# Patient Record
Sex: Female | Born: 1965 | Race: White | Hispanic: No | Marital: Married | Smoking: Current every day smoker
Health system: Southern US, Community
[De-identification: ages and names within clinical notes are randomized; demographics above are authoritative.]

## PROBLEM LIST (undated history)

## (undated) DIAGNOSIS — I639 Cerebral infarction, unspecified: Secondary | ICD-10-CM

## (undated) DIAGNOSIS — C801 Malignant (primary) neoplasm, unspecified: Secondary | ICD-10-CM

## (undated) DIAGNOSIS — J45909 Unspecified asthma, uncomplicated: Secondary | ICD-10-CM

## (undated) DIAGNOSIS — G43909 Migraine, unspecified, not intractable, without status migrainosus: Secondary | ICD-10-CM

## (undated) HISTORY — DX: Cerebral infarction, unspecified: I63.9

## (undated) HISTORY — DX: Unspecified asthma, uncomplicated: J45.909

## (undated) HISTORY — DX: Migraine, unspecified, not intractable, without status migrainosus: G43.909

## (undated) HISTORY — DX: Malignant (primary) neoplasm, unspecified: C80.1

---

## 1993-07-23 HISTORY — PX: MASTECTOMY: SHX3

## 2006-08-20 ENCOUNTER — Emergency Department: Payer: Self-pay | Admitting: Emergency Medicine

## 2008-08-10 ENCOUNTER — Ambulatory Visit: Payer: Self-pay

## 2008-12-28 ENCOUNTER — Emergency Department: Payer: Self-pay | Admitting: Unknown Physician Specialty

## 2009-07-07 ENCOUNTER — Emergency Department: Payer: Self-pay | Admitting: Emergency Medicine

## 2013-12-16 ENCOUNTER — Ambulatory Visit: Payer: Self-pay | Admitting: Internal Medicine

## 2014-07-23 HISTORY — PX: CHOLECYSTECTOMY: SHX55

## 2014-08-17 ENCOUNTER — Emergency Department: Payer: Self-pay | Admitting: Emergency Medicine

## 2014-12-08 ENCOUNTER — Other Ambulatory Visit: Payer: Self-pay

## 2014-12-15 ENCOUNTER — Ambulatory Visit: Payer: Self-pay | Admitting: Internal Medicine

## 2015-01-12 ENCOUNTER — Ambulatory Visit: Payer: Self-pay | Admitting: Internal Medicine

## 2015-01-12 DIAGNOSIS — J441 Chronic obstructive pulmonary disease with (acute) exacerbation: Secondary | ICD-10-CM | POA: Insufficient documentation

## 2015-05-24 ENCOUNTER — Other Ambulatory Visit: Payer: Self-pay

## 2015-06-07 ENCOUNTER — Other Ambulatory Visit: Payer: Self-pay

## 2015-06-07 LAB — TSH: TSH: 1.59 u[IU]/mL (ref 0.41–5.90)

## 2015-06-07 LAB — HEPATIC FUNCTION PANEL
ALT: 9 U/L (ref 7–35)
AST: 6 U/L — AB (ref 13–35)
Alkaline Phosphatase: 93 U/L (ref 25–125)
Bilirubin, Total: 0.2 mg/dL

## 2015-06-07 LAB — LIPID PANEL
CHOLESTEROL: 243 mg/dL — AB (ref 0–200)
HDL: 54 mg/dL (ref 35–70)
LDL Cholesterol: 172 mg/dL
Triglycerides: 87 mg/dL (ref 40–160)

## 2015-06-07 LAB — CBC AND DIFFERENTIAL
HCT: 42 % (ref 36–46)
HEMOGLOBIN: 14.1 g/dL (ref 12.0–16.0)
Neutrophils Absolute: 6 /uL
PLATELETS: 350 10*3/uL (ref 150–399)
WBC: 11 10*3/mL

## 2015-06-07 LAB — BASIC METABOLIC PANEL
BUN: 10 mg/dL (ref 4–21)
CREATININE: 0.7 mg/dL (ref 0.5–1.1)
GLUCOSE: 91 mg/dL
POTASSIUM: 4.3 mmol/L (ref 3.4–5.3)
Sodium: 142 mmol/L (ref 137–147)

## 2015-06-09 ENCOUNTER — Ambulatory Visit: Payer: Self-pay

## 2015-06-09 DIAGNOSIS — J45909 Unspecified asthma, uncomplicated: Secondary | ICD-10-CM | POA: Insufficient documentation

## 2015-06-09 DIAGNOSIS — Z720 Tobacco use: Secondary | ICD-10-CM | POA: Insufficient documentation

## 2015-06-09 DIAGNOSIS — E785 Hyperlipidemia, unspecified: Secondary | ICD-10-CM | POA: Insufficient documentation

## 2015-07-07 ENCOUNTER — Other Ambulatory Visit: Payer: Self-pay

## 2015-07-14 ENCOUNTER — Ambulatory Visit: Payer: Self-pay

## 2015-10-04 ENCOUNTER — Other Ambulatory Visit: Payer: Self-pay

## 2015-10-04 NOTE — Telephone Encounter (Signed)
Received fax from [facility] [address] refill request Ventolin Inhaler HFA 90 Mcg inhaler. Patient has not been seen at [facility] since November 2015 and has no future appointments scheduled.

## 2015-10-13 MED ORDER — ALBUTEROL SULFATE HFA 108 (90 BASE) MCG/ACT IN AERS: 1.0000 | INHALATION_SPRAY | RESPIRATORY_TRACT | Status: DC | PRN

## 2015-10-28 ENCOUNTER — Other Ambulatory Visit: Payer: Self-pay

## 2015-10-28 NOTE — Telephone Encounter (Signed)
Received fax from [facility] [address] [place] to refill Ventolin HFA 90 Mcg inhaler

## 2015-11-02 MED ORDER — ALBUTEROL SULFATE HFA 108 (90 BASE) MCG/ACT IN AERS: 1.0000 | INHALATION_SPRAY | RESPIRATORY_TRACT | Status: AC | PRN

## 2015-12-20 DIAGNOSIS — J449 Chronic obstructive pulmonary disease, unspecified: Secondary | ICD-10-CM

## 2015-12-20 DIAGNOSIS — J45909 Unspecified asthma, uncomplicated: Secondary | ICD-10-CM

## 2015-12-20 DIAGNOSIS — E785 Hyperlipidemia, unspecified: Secondary | ICD-10-CM

## 2015-12-20 DIAGNOSIS — Z720 Tobacco use: Secondary | ICD-10-CM

## 2016-03-14 NOTE — Progress Notes (Unsigned)
return to work

## 2016-03-20 ENCOUNTER — Ambulatory Visit: Payer: Self-pay

## 2016-03-20 ENCOUNTER — Ambulatory Visit: Payer: Self-pay | Admitting: Nurse Practitioner

## 2016-03-20 VITALS — BP 140/84 | HR 99 | Wt 168.0 lb

## 2016-03-20 DIAGNOSIS — E782 Mixed hyperlipidemia: Secondary | ICD-10-CM

## 2016-03-20 DIAGNOSIS — R5383 Other fatigue: Secondary | ICD-10-CM

## 2016-03-20 DIAGNOSIS — E162 Hypoglycemia, unspecified: Secondary | ICD-10-CM

## 2016-03-20 LAB — GLUCOSE, POCT (MANUAL RESULT ENTRY): POC Glucose: 93 mg/dl (ref 70–99)

## 2016-03-20 MED ORDER — BUDESONIDE-FORMOTEROL FUMARATE 160-4.5 MCG/ACT IN AERO: 2.0000 | INHALATION_SPRAY | Freq: Two times a day (BID) | RESPIRATORY_TRACT | 5 refills | Status: DC

## 2016-03-20 NOTE — Progress Notes (Signed)
Patient states she hasn't taken the Advair or Ventolin in 40 days, but says the Symbicort is effective by itself. She wants to solely take the Symbicort  SMOKING ONE PACK PER DAY, WAS SMOKING 4 PACKS PER DAY 2 YEARS AGO  HAS NOT HAD ANY LABS DRAWN FOR 9 MONTHS  DOES ACKNOWLEDGE ONE EPISODE OF HYPOGLYCEMIA     ALERT VAERBALLY APPROPRIATE IN NAD  NO THYROMEGALY, NO CERVICAL ADENOPATHY  BBrS DIMINISHED AS EXPECTED IN PT WITH COPD   PLAN WILL ORDER LABS AND RENEW SYMBICORT, WILL SEE PT BACK IN 6 MONTHS AND SOONER IF NECESSARY

## 2016-03-27 ENCOUNTER — Other Ambulatory Visit: Payer: Self-pay

## 2016-09-25 ENCOUNTER — Ambulatory Visit: Payer: Self-pay

## 2018-01-13 ENCOUNTER — Inpatient Hospital Stay (HOSPITAL_COMMUNITY): Payer: Self-pay

## 2018-01-13 ENCOUNTER — Encounter (HOSPITAL_COMMUNITY): Payer: Self-pay | Admitting: Internal Medicine

## 2018-01-13 ENCOUNTER — Inpatient Hospital Stay (HOSPITAL_COMMUNITY)
Admission: AD | Admit: 2018-01-13 | Discharge: 2018-01-18 | DRG: 190 | Disposition: A | Discharge status: Home / Self Care | Payer: Self-pay | Source: Other Acute Inpatient Hospital | Attending: Internal Medicine | Admitting: Internal Medicine

## 2018-01-13 DIAGNOSIS — J9622 Acute and chronic respiratory failure with hypercapnia: Secondary | ICD-10-CM | POA: Diagnosis present

## 2018-01-13 DIAGNOSIS — J9621 Acute and chronic respiratory failure with hypoxia: Secondary | ICD-10-CM | POA: Diagnosis present

## 2018-01-13 DIAGNOSIS — R739 Hyperglycemia, unspecified: Secondary | ICD-10-CM | POA: Diagnosis present

## 2018-01-13 DIAGNOSIS — T380X5A Adverse effect of glucocorticoids and synthetic analogues, initial encounter: Secondary | ICD-10-CM | POA: Diagnosis present

## 2018-01-13 DIAGNOSIS — B370 Candidal stomatitis: Secondary | ICD-10-CM | POA: Diagnosis present

## 2018-01-13 DIAGNOSIS — G4710 Hypersomnia, unspecified: Secondary | ICD-10-CM | POA: Diagnosis not present

## 2018-01-13 DIAGNOSIS — J9612 Chronic respiratory failure with hypercapnia: Secondary | ICD-10-CM

## 2018-01-13 DIAGNOSIS — Z720 Tobacco use: Secondary | ICD-10-CM

## 2018-01-13 DIAGNOSIS — J189 Pneumonia, unspecified organism: Secondary | ICD-10-CM

## 2018-01-13 DIAGNOSIS — Z8673 Personal history of transient ischemic attack (TIA), and cerebral infarction without residual deficits: Secondary | ICD-10-CM

## 2018-01-13 DIAGNOSIS — R490 Dysphonia: Secondary | ICD-10-CM | POA: Diagnosis present

## 2018-01-13 DIAGNOSIS — Z7951 Long term (current) use of inhaled steroids: Secondary | ICD-10-CM

## 2018-01-13 DIAGNOSIS — G9341 Metabolic encephalopathy: Secondary | ICD-10-CM | POA: Diagnosis present

## 2018-01-13 DIAGNOSIS — Z79899 Other long term (current) drug therapy: Secondary | ICD-10-CM

## 2018-01-13 DIAGNOSIS — Z91018 Allergy to other foods: Secondary | ICD-10-CM

## 2018-01-13 DIAGNOSIS — Z9981 Dependence on supplemental oxygen: Secondary | ICD-10-CM

## 2018-01-13 DIAGNOSIS — Z8543 Personal history of malignant neoplasm of ovary: Secondary | ICD-10-CM

## 2018-01-13 DIAGNOSIS — G9340 Encephalopathy, unspecified: Secondary | ICD-10-CM

## 2018-01-13 DIAGNOSIS — J440 Chronic obstructive pulmonary disease with acute lower respiratory infection: Secondary | ICD-10-CM | POA: Diagnosis present

## 2018-01-13 DIAGNOSIS — Z9013 Acquired absence of bilateral breasts and nipples: Secondary | ICD-10-CM

## 2018-01-13 DIAGNOSIS — J9611 Chronic respiratory failure with hypoxia: Secondary | ICD-10-CM

## 2018-01-13 DIAGNOSIS — Z853 Personal history of malignant neoplasm of breast: Secondary | ICD-10-CM

## 2018-01-13 DIAGNOSIS — J441 Chronic obstructive pulmonary disease with (acute) exacerbation: Secondary | ICD-10-CM

## 2018-01-13 DIAGNOSIS — F17210 Nicotine dependence, cigarettes, uncomplicated: Secondary | ICD-10-CM | POA: Diagnosis present

## 2018-01-13 DIAGNOSIS — R0602 Shortness of breath: Secondary | ICD-10-CM

## 2018-01-13 DIAGNOSIS — J9382 Other air leak: Secondary | ICD-10-CM | POA: Diagnosis present

## 2018-01-13 DIAGNOSIS — J9601 Acute respiratory failure with hypoxia: Secondary | ICD-10-CM | POA: Diagnosis present

## 2018-01-13 DIAGNOSIS — Z8249 Family history of ischemic heart disease and other diseases of the circulatory system: Secondary | ICD-10-CM

## 2018-01-13 LAB — GLUCOSE, CAPILLARY
GLUCOSE-CAPILLARY: 147 mg/dL — AB (ref 65–99)
Glucose-Capillary: 155 mg/dL — ABNORMAL HIGH (ref 65–99)

## 2018-01-13 LAB — D-DIMER, QUANTITATIVE: D-Dimer, Quant: 1.71 ug/mL-FEU — ABNORMAL HIGH (ref 0.00–0.50)

## 2018-01-13 LAB — LACTIC ACID, PLASMA: Lactic Acid, Venous: 1.2 mmol/L (ref 0.5–1.9)

## 2018-01-13 MED ORDER — IPRATROPIUM-ALBUTEROL 0.5-2.5 (3) MG/3ML IN SOLN
3.0000 mL | RESPIRATORY_TRACT | Status: DC
  Administered 2018-01-13: 3 mL via RESPIRATORY_TRACT
  Filled 2018-01-13 (×2): qty 3

## 2018-01-13 MED ORDER — SODIUM CHLORIDE 0.9 % IV SOLN
1.0000 g | INTRAVENOUS | Status: DC
  Administered 2018-01-13 – 2018-01-16 (×4): 1 g via INTRAVENOUS
  Filled 2018-01-13 (×5): qty 10

## 2018-01-13 MED ORDER — ACETAMINOPHEN 325 MG PO TABS: 650.0000 mg | ORAL_TABLET | Freq: Four times a day (QID) | ORAL | Status: DC | PRN

## 2018-01-13 MED ORDER — ENOXAPARIN SODIUM 40 MG/0.4ML ~~LOC~~ SOLN
40.0000 mg | SUBCUTANEOUS | Status: DC
  Administered 2018-01-13 – 2018-01-17 (×5): 40 mg via SUBCUTANEOUS
  Filled 2018-01-13 (×6): qty 0.4

## 2018-01-13 MED ORDER — MORPHINE SULFATE (PF) 2 MG/ML IV SOLN: 1.0000 mg | INTRAVENOUS | Status: DC | PRN

## 2018-01-13 MED ORDER — AZITHROMYCIN 500 MG IV SOLR
500.0000 mg | INTRAVENOUS | Status: DC
  Administered 2018-01-14 (×2): 500 mg via INTRAVENOUS
  Filled 2018-01-13 (×2): qty 500

## 2018-01-13 MED ORDER — BUDESONIDE 0.25 MG/2ML IN SUSP
0.2500 mg | Freq: Two times a day (BID) | RESPIRATORY_TRACT | Status: DC
  Administered 2018-01-13: 0.25 mg via RESPIRATORY_TRACT
  Filled 2018-01-13: qty 2

## 2018-01-13 MED ORDER — NICOTINE 14 MG/24HR TD PT24
14.0000 mg | MEDICATED_PATCH | Freq: Every day | TRANSDERMAL | Status: DC
  Administered 2018-01-14 – 2018-01-15 (×2): 14 mg via TRANSDERMAL
  Filled 2018-01-13 (×4): qty 1

## 2018-01-13 MED ORDER — ONDANSETRON HCL 4 MG/2ML IJ SOLN: 4.0000 mg | Freq: Four times a day (QID) | INTRAMUSCULAR | Status: DC | PRN

## 2018-01-13 MED ORDER — METHYLPREDNISOLONE SODIUM SUCC 125 MG IJ SOLR: 60.0000 mg | Freq: Two times a day (BID) | INTRAMUSCULAR | Status: DC

## 2018-01-13 MED ORDER — DM-GUAIFENESIN ER 30-600 MG PO TB12
1.0000 | ORAL_TABLET | Freq: Two times a day (BID) | ORAL | Status: DC
  Administered 2018-01-14 – 2018-01-18 (×9): 1 via ORAL
  Filled 2018-01-13 (×10): qty 1

## 2018-01-13 MED ORDER — BUDESONIDE 0.5 MG/2ML IN SUSP
0.5000 mg | Freq: Two times a day (BID) | RESPIRATORY_TRACT | Status: DC
  Administered 2018-01-14 – 2018-01-18 (×9): 0.5 mg via RESPIRATORY_TRACT
  Filled 2018-01-13 (×10): qty 2

## 2018-01-13 MED ORDER — ARFORMOTEROL TARTRATE 15 MCG/2ML IN NEBU
15.0000 ug | INHALATION_SOLUTION | Freq: Two times a day (BID) | RESPIRATORY_TRACT | Status: DC
  Administered 2018-01-13 – 2018-01-14 (×2): 15 ug via RESPIRATORY_TRACT
  Filled 2018-01-13 (×2): qty 2

## 2018-01-13 MED ORDER — IPRATROPIUM-ALBUTEROL 0.5-2.5 (3) MG/3ML IN SOLN: 3.0000 mL | RESPIRATORY_TRACT | Status: DC | PRN

## 2018-01-13 MED ORDER — ACETAMINOPHEN 650 MG RE SUPP: 650.0000 mg | Freq: Four times a day (QID) | RECTAL | Status: DC | PRN

## 2018-01-13 MED ORDER — ONDANSETRON HCL 4 MG PO TABS: 4.0000 mg | ORAL_TABLET | Freq: Four times a day (QID) | ORAL | Status: DC | PRN

## 2018-01-13 MED ORDER — BENZONATATE 100 MG PO CAPS
100.0000 mg | ORAL_CAPSULE | Freq: Three times a day (TID) | ORAL | Status: DC
  Administered 2018-01-14 – 2018-01-18 (×13): 100 mg via ORAL
  Filled 2018-01-13 (×14): qty 1

## 2018-01-13 MED ORDER — METHYLPREDNISOLONE SODIUM SUCC 125 MG IJ SOLR
60.0000 mg | Freq: Four times a day (QID) | INTRAMUSCULAR | Status: DC
  Administered 2018-01-13 – 2018-01-15 (×6): 60 mg via INTRAVENOUS
  Filled 2018-01-13 (×6): qty 2

## 2018-01-13 MED ORDER — IPRATROPIUM-ALBUTEROL 0.5-2.5 (3) MG/3ML IN SOLN
3.0000 mL | Freq: Four times a day (QID) | RESPIRATORY_TRACT | Status: DC
  Administered 2018-01-14 (×2): 3 mL via RESPIRATORY_TRACT
  Filled 2018-01-13 (×2): qty 3

## 2018-01-13 NOTE — Consult Note (Addendum)
PULMONARY / CRITICAL CARE MEDICINE  Name: Sandra Ramsey MRN: [id number] DOB: 11/20/1965    ADMISSION DATE:  01/13/2018 CONSULTATION DATE:  01/13/18  REFERRING MD :  Hal Hope  CHIEF COMPLAINT:  SOB   HISTORY OF PRESENT ILLNESS:  Sandra Ramsey is a 52 y.o. female with a PMH as outlined below.  She presented to [facility] 01/13/18 with SOB and productive cough that had worsened over the week prior.  She has apparently had a cough productive of green and black colored sputum and has been seen and placed on multiple abx for bronchitis / PNA; however, symptoms didn't improve much.  In ED, she had increased WOB and was hypoxic and required 5L [place] to maintain sats in 90s.  It was felt that she had AECOPD vs PNA vs CHF exacerbation.  CXR showed some edema and BNP was 2000; therefore, she received 20mg  lasix.  Later had increased WOB again so VBG was obtained which showed hypercapnia (7.3 / 61 / 91).  She was started on BiPAP and settings were increased to 18/5.  She was later transferred to [facility] under the [facility] service.  After arrival to [facility], she had continued mild increased WOB; therefore, PCCM was asked to see in consultation.  On our evaluation, she is on BiPAP at [place] but has significant air leak (as high as 125L).  Mask was adjusted and settings were increased to 20/8.  Pt denies any dyspnea at this time (however, she does appear to have some dyspnea).  She has pleuritic chest pain but denies chest pain at rest.  Denies fevers/chills/sweats, N/V/D, abd pain, exposures to known sick contacts, recent travel.  Denies any recent hospital admission.  She does feel better compared to when she first arrived at [facility].  She does tell us that she uses O2 at home though mainly on a PRN basis (up to 5L).  Also tells [place] that she takes prednisone on PRN basis.   ADDENDUM 2355:  Called back to bedside due to pt being more difficult to arouse.  Pt hypersomnolent and is more difficult to arouse; however,  once aroused, she is appropriately answering questions and following commands.  ABG with hypoxemia and hypercapnia (7.33 / 61 / 72).  Nursing staff very concerned and report that pt has gradually gotten more lethargic since arriving to the floor.    PAST MEDICAL HISTORY :   has a past medical history of Asthma, Cancer ([place]), CVA (cerebral infarction), and Migraines.  has a past surgical history that includes Cholecystectomy (2016); Cesarean section; and Mastectomy (Bilateral, 1995). Prior to Admission medications   Medication Sig Start Date End Date Taking? Authorizing Provider  albuterol (VENTOLIN HFA) 108 (90 Base) MCG/ACT inhaler Inhale 1-2 puffs into the lungs every 4 (four) hours as needed for wheezing or shortness of breath. Patient not taking: Reported on 03/20/2016 11/02/15   Tawni Millers, MD  budesonide-formoterol [facility] 160-4.5 MCG/ACT inhaler Inhale 2 puffs into the lungs 2 (two) times daily. 03/20/16   Boyce Medici, FNP  Fluticasone-Salmeterol (ADVAIR DISKUS) 500-50 MCG/DOSE AEPB Inhale 1 puff into the lungs 2 (two) times daily.    [provider]   Allergies  Allergen Reactions  . Tomato Hives    FAMILY HISTORY:  family history includes Bipolar disorder in her mother; COPD in her father; Cancer in her paternal grandmother and sister; Dementia in her mother; Diabetes in her mother; Emphysema in her father; Hypertension in her mother. SOCIAL HISTORY:  reports that she has been  smoking.  She has been smoking about 1.00 pack per day. She has never used smokeless tobacco. She reports that she does not drink alcohol.  REVIEW OF SYSTEMS:   All negative; except for those that are bolded, which indicate positives.  Constitutional: weight loss, weight gain, night sweats, fevers, chills, fatigue, weakness.  HEENT: headaches, sore throat, sneezing, nasal congestion, post nasal drip, difficulty swallowing, tooth/dental problems, visual complaints, visual changes, ear  aches. Neuro: difficulty with speech, weakness, numbness, ataxia. CV:  chest pain, orthopnea, PND, swelling in lower extremities, dizziness, palpitations, syncope.  Resp: cough, hemoptysis, dyspnea, wheezing. GI: heartburn, indigestion, abdominal pain, nausea, vomiting, diarrhea, constipation, change in bowel habits, loss of appetite, hematemesis, melena, hematochezia.  GU: dysuria, change in color of urine, urgency or frequency, flank pain, hematuria. MSK: joint pain or swelling, decreased range of motion. Psych: change in mood or affect, depression, anxiety, suicidal ideations, homicidal ideations. Skin: rash, itching, bruising.   SUBJECTIVE:  On BiPAP at 20 / 8.  Denies any dyspnea.  VITAL SIGNS: Pulse Rate:  [135] 135 (06/24 2117) Resp:  [33] 33 (06/24 2117) SpO2:  [93 %-96 %] 96 % (06/24 2209) FiO2 (%):  [35 %] 35 % (06/24 2209)  PHYSICAL EXAMINATION: General: Adult female, resting in bed, in NAD. Neuro: Somnolent but easily arouses to voice and follows commands.  No focal deficits.Marland Kitchen HEENT: [place]/AT. Sclerae anicteric, BiPAP in place. Cardiovascular: Tachy, regular, no M/R/G.  Lungs: Respirations shallow.  Diminished though clear breath sounds. Abdomen: BS x 4, soft, NT/ND.  Musculoskeletal: No gross deformities, no edema.  Skin: Intact, warm, no rashes.   No results for input(s): NA, K, CL, CO2, BUN, CREATININE, GLUCOSE in the last 168 hours. No results for input(s): HGB, HCT, WBC, PLT in the last 168 hours. Dg Chest Port 1 View  Result Date: 01/13/2018 CLINICAL DATA:  Dyspnea, cough EXAM: PORTABLE CHEST 1 VIEW COMPARISON:  None. FINDINGS: Normal heart size. Normal mediastinal contour. No pneumothorax. No pleural effusion. Patchy faintly nodular opacities throughout both lungs, most prominent in the lower lungs. IMPRESSION: Patchy faintly nodular opacities throughout both lungs, most prominent in the lower lungs. Per epic, the patient has a remote history of breast and ovarian  cancer. Chest CT recommended for further characterization of these opacities. Electronically Signed   By: Ilona Sorrel M.D.   On: 01/13/2018 22:10    STUDIES:  CXR 6/24 > faint nodular opacities in lower lungs.  SIGNIFICANT EVENTS  6/24 > admit, PCCM consult.  ASSESSMENT / PLAN:  Acute on chronic hypoxemic and hypercapnic respiratory failure - presumed due to mod - severe underlying COPD.  Reports that she uses up to 5L O2 via [place] on a PRN basis. Probable PNA - no hx hospital / healthcare exposure or admission. Mild pulmonary edema - ? Underlying CHF.  Had echo at [facility] that was normal per care everywhere (though BNP was 2000; received 20mg  lasix). Hx COPD - does not follow with pulmonologist though states she was scheduled to see one soon. ? Nodular basilar opacities. Plan: Continue BiPAP at current settings (20 / 8). Repeat ABG at midnight.  Continue BD's, steroids. Low dose morphine added. Continue abx (ceftriaxone / azithromycin). Follow cultures and PCT. Might need additional lasix. Consider repeat formal echo. Consider CT chest to further assess nodules (per epic, has hx breast and ovarian cancer).  Rest per primary team.   ADDENDUM 2355: Will transfer to ICU for closer monitoring tonight. Continue BiPAP for now. Repeat ABG at 0200, if no  improvement then will need to re-consider intubation (deferring for now given ability to wake pt, fact that she is following commands once aroused, normal ABG). D/c PRN morphine.   Montey Hora, [place] [facility] Pager: [phone]  or [phone] 01/13/2018, 10:41 PM

## 2018-01-13 NOTE — Progress Notes (Signed)
Patient has declined to wear her BIPAP mask appropriately. RT placed the patient on the BIPAP and the patient loosened the mask up. The patient has a leak of approximately 60-80. The patient was educated on the proper use of the mask and was still argumentative and refusing to let RT tighten the mask up. Dr. Hal Hope was notified of this as well.

## 2018-01-13 NOTE — Progress Notes (Signed)
Patient came in on the BIPAP from EMS. The patient was placed on her settings that the EMS brought her in with. Will continue to monitor.

## 2018-01-13 NOTE — H&P (Addendum)
History and Physical    Sandra Ramsey TDH:[id number] DOB: [birth year] DOA: 01/13/2018  PCP: No primary care provider on file.  Patient coming from: Patient was transferred from [facility].  Chief Complaint: Shortness of breath.  HPI: Sandra Ramsey is a 52 y.o. female with history of COPD and ongoing tobacco abuse has been experiencing shortness of breath and productive cough for last 1 month.  Patient had completed 2 course of antibiotics for 5 days each and is on the third dose of antibiotics last 3 days.  Because of worsening shortness of breath with chest congestion and tightness patient presented to the ER at [facility]  She had some hospital x-rays revealed congestion.  EKG shows sinus tachycardia.  proBNP was 2040.  Metabolic panel showed glucose of 167 chloride 95 carbon dioxide 35 BUN 6 creatinine 0.5 troponin was 0.073 WBC count was 17.3 platelets 382.  Patient was given nebulizer treatment Lasix antibiotics and placed on BiPAP.  Patient was transferred to [facility] for further work-up.  On my exam patient is acutely short of breath.  Complains of chest tightness.  Patient's had entry diminished bilaterally.  Denies any abdominal pain nausea vomiting or diarrhea.  ED Course: Patient is a direct admit.  Review of Systems: As per HPI, rest all negative.   Past Medical History:  Diagnosis Date  . Asthma   . Cancer ([place])    Breast (1995) and Ovarian (1981)  . CVA (cerebral infarction)   . Migraines     Past Surgical History:  Procedure Laterality Date  . CESAREAN SECTION     1991  . CHOLECYSTECTOMY  2016  . MASTECTOMY Bilateral 1995     reports that she has been smoking.  She has been smoking about 1.00 pack per day. She has never used smokeless tobacco. She reports that she does not drink alcohol. Her drug history is not on file.  Allergies  Allergen Reactions  . Tomato Hives    Family History  Problem Relation Age of Onset  . COPD Father   . Emphysema  Father   . Dementia Mother   . Hypertension Mother   . Diabetes Mother   . Bipolar disorder Mother   . Cancer Sister        Brain Tumor  . Cancer Paternal Grandmother        Breast    Prior to Admission medications   Medication Sig Start Date End Date Taking? Authorizing Provider  albuterol (VENTOLIN HFA) 108 (90 Base) MCG/ACT inhaler Inhale 1-2 puffs into the lungs every 4 (four) hours as needed for wheezing or shortness of breath. Patient not taking: Reported on 03/20/2016 11/02/15   Tawni Millers, MD  budesonide-formoterol [facility]) 160-4.5 MCG/ACT inhaler Inhale 2 puffs into the lungs 2 (two) times daily. 03/20/16   Boyce Medici, FNP  Fluticasone-Salmeterol (ADVAIR DISKUS) 500-50 MCG/DOSE AEPB Inhale 1 puff into the lungs 2 (two) times daily.    [provider]    Physical Exam: Vitals:   01/13/18 2117  Pulse: (!) 135  Resp: (!) 33  SpO2: 93%      Constitutional: Moderately built and nourished.  Temperature is 97.4.  Respirations 30/min blood pressure is 120/70.  Pulse is 135/min.  O2 sat is 93% on BiPAP. Vitals:   01/13/18 2117  Pulse: (!) 135  Resp: (!) 33  SpO2: 93%   Eyes: Anicteric no pallor. ENMT: No discharge from the ears eyes nose or mouth. Neck: No JVD appreciated no mass  felt. Respiratory: Bilateral air entry is diminished.  No crepitations. Cardiovascular: S1-S2 heard no murmurs appreciated. Abdomen: Soft nontender bowel sounds present. Musculoskeletal: No edema.  No joint effusion. Skin: No rash. Neurologic: Alert awake oriented to time place and person.  Moves all extremities. Psychiatric: Appears normal per normal affect.   Labs on Admission: I have personally reviewed following labs and imaging studies  CBC: No results for input(s): WBC, NEUTROABS, HGB, HCT, MCV, PLT in the last 168 hours. Basic Metabolic Panel: No results for input(s): NA, K, CL, CO2, GLUCOSE, BUN, CREATININE, CALCIUM, MG, PHOS in the last 168 hours. GFR: CrCl  cannot be calculated (Patient's most recent lab result is older than the maximum 21 days allowed.). Liver Function Tests: No results for input(s): AST, ALT, ALKPHOS, BILITOT, PROT, ALBUMIN in the last 168 hours. No results for input(s): LIPASE, AMYLASE in the last 168 hours. No results for input(s): AMMONIA in the last 168 hours. Coagulation Profile: No results for input(s): INR, PROTIME in the last 168 hours. Cardiac Enzymes: No results for input(s): CKTOTAL, CKMB, CKMBINDEX, TROPONINI in the last 168 hours. BNP (last 3 results) No results for input(s): PROBNP in the last 8760 hours. HbA1C: No results for input(s): HGBA1C in the last 72 hours. CBG: No results for input(s): GLUCAP in the last 168 hours. Lipid Profile: No results for input(s): CHOL, HDL, LDLCALC, TRIG, CHOLHDL, LDLDIRECT in the last 72 hours. Thyroid Function Tests: No results for input(s): TSH, T4TOTAL, FREET4, T3FREE, THYROIDAB in the last 72 hours. Anemia Panel: No results for input(s): VITAMINB12, FOLATE, FERRITIN, TIBC, IRON, RETICCTPCT in the last 72 hours. Urine analysis: No results found for: COLORURINE, APPEARANCEUR, LABSPEC, PHURINE, GLUCOSEU, HGBUR, BILIRUBINUR, KETONESUR, PROTEINUR, UROBILINOGEN, NITRITE, LEUKOCYTESUR Sepsis Labs: @LABRCNTIP (procalcitonin:4,lacticidven:4) )No results found for this or any previous visit (from the past 240 hour(s)).   Radiological Exams on Admission: No results found.  EKG: Independently reviewed.  Sinus tachycardia.  Assessment/Plan Principal Problem:   Acute respiratory failure with hypoxia (HCC) Active Problems:   Tobacco abuse   COPD exacerbation ([place])    1. Acute respiratory failure with hypoxia and hypercarbia  -likely a combination of COPD/pneumonia/CHF.  Chest x-ray ABG basic metabolic panel troponin CBC and BNP and d-dimer are pending.  I have placed patient on nebulizer treatment Pulmicort steroids and will start antibiotics.  Blood cultures and pro  calcitonin levels and lactate levels have been ordered.  Will keep patient n.p.o. continue BiPAP.  Check 2D echo.  Patient did receive Lasix at the ER in [facility].    I have consulted pulmonary critical care. 2. Tobacco abuse -advised to quit smoking. 3. History of breast cancer status post mastectomy.     DVT prophylaxis: Lovenox. Code Status: Full code. Family Communication: Discussed with patient. Disposition Plan: Home. Consults called: Pulmonary critical care. Admission status: Inpatient.   Rise Patience MD [facility] Pager [phone].  If 7PM-7AM, please contact night-coverage [email or web address] Password [facility]  01/13/2018, 9:56 PM

## 2018-01-14 ENCOUNTER — Inpatient Hospital Stay (HOSPITAL_COMMUNITY): Payer: Self-pay

## 2018-01-14 ENCOUNTER — Other Ambulatory Visit: Payer: Self-pay

## 2018-01-14 DIAGNOSIS — G9340 Encephalopathy, unspecified: Secondary | ICD-10-CM

## 2018-01-14 DIAGNOSIS — J9601 Acute respiratory failure with hypoxia: Secondary | ICD-10-CM

## 2018-01-14 DIAGNOSIS — J9612 Chronic respiratory failure with hypercapnia: Secondary | ICD-10-CM

## 2018-01-14 DIAGNOSIS — J9611 Chronic respiratory failure with hypoxia: Secondary | ICD-10-CM

## 2018-01-14 LAB — CBC WITH DIFFERENTIAL/PLATELET
ABS IMMATURE GRANULOCYTES: 0.3 10*3/uL — AB (ref 0.0–0.1)
Basophils Absolute: 0.1 10*3/uL (ref 0.0–0.1)
Basophils Relative: 1 %
EOS PCT: 1 %
Eosinophils Absolute: 0.2 10*3/uL (ref 0.0–0.7)
HEMATOCRIT: 40.6 % (ref 36.0–46.0)
HEMOGLOBIN: 12.5 g/dL (ref 12.0–15.0)
Immature Granulocytes: 2 %
LYMPHS ABS: 1.3 10*3/uL (ref 0.7–4.0)
LYMPHS PCT: 8 %
MCH: 28.4 pg (ref 26.0–34.0)
MCHC: 30.8 g/dL (ref 30.0–36.0)
MCV: 92.3 fL (ref 78.0–100.0)
Monocytes Absolute: 0.7 10*3/uL (ref 0.1–1.0)
Monocytes Relative: 4 %
NEUTROS ABS: 14.9 10*3/uL — AB (ref 1.7–7.7)
NEUTROS PCT: 84 %
Platelets: 338 10*3/uL (ref 150–400)
RBC: 4.4 MIL/uL (ref 3.87–5.11)
RDW: 14.8 % (ref 11.5–15.5)
WBC: 17.5 10*3/uL — AB (ref 4.0–10.5)

## 2018-01-14 LAB — BLOOD GAS, ARTERIAL
Acid-Base Excess: 6.1 mmol/L — ABNORMAL HIGH (ref 0.0–2.0)
Acid-Base Excess: 6.5 mmol/L — ABNORMAL HIGH (ref 0.0–2.0)
BICARBONATE: 32.2 mmol/L — AB (ref 20.0–28.0)
Bicarbonate: 32 mmol/L — ABNORMAL HIGH (ref 20.0–28.0)
DELIVERY SYSTEMS: POSITIVE
DRAWN BY: 313941
Delivery systems: POSITIVE
Drawn by: 237031
EXPIRATORY PAP: 5
Expiratory PAP: 8
FIO2: 35
FIO2: 35
Inspiratory PAP: 18
Inspiratory PAP: 20
LHR: 10 {breaths}/min
O2 SAT: 93.3 %
O2 Saturation: 93.9 %
PATIENT TEMPERATURE: 98.6
PATIENT TEMPERATURE: 98.1
PCO2 ART: 65.4 mmHg — AB (ref 32.0–48.0)
PH ART: 7.311 — AB (ref 7.350–7.450)
PH ART: 7.338 — AB (ref 7.350–7.450)
pCO2 arterial: 61.4 mmHg — ABNORMAL HIGH (ref 32.0–48.0)
pO2, Arterial: 72.7 mmHg — ABNORMAL LOW (ref 83.0–108.0)
pO2, Arterial: 74.2 mmHg — ABNORMAL LOW (ref 83.0–108.0)

## 2018-01-14 LAB — POCT I-STAT 3, ART BLOOD GAS (G3+)
ACID-BASE EXCESS: 6 mmol/L — AB (ref 0.0–2.0)
BICARBONATE: 33.8 mmol/L — AB (ref 20.0–28.0)
O2 Saturation: 94 %
Patient temperature: 98.1
TCO2: 36 mmol/L — ABNORMAL HIGH (ref 22–32)
pCO2 arterial: 59.8 mmHg — ABNORMAL HIGH (ref 32.0–48.0)
pH, Arterial: 7.359 (ref 7.350–7.450)
pO2, Arterial: 74 mmHg — ABNORMAL LOW (ref 83.0–108.0)

## 2018-01-14 LAB — BASIC METABOLIC PANEL
Anion gap: 10 (ref 5–15)
Anion gap: 10 (ref 5–15)
BUN: 6 mg/dL (ref 6–20)
BUN: 9 mg/dL (ref 6–20)
CHLORIDE: 93 mmol/L — AB (ref 101–111)
CO2: 33 mmol/L — ABNORMAL HIGH (ref 22–32)
CO2: 32 mmol/L (ref 22–32)
Calcium: 9.1 mg/dL (ref 8.9–10.3)
Calcium: 8.8 mg/dL — ABNORMAL LOW (ref 8.9–10.3)
Chloride: 96 mmol/L — ABNORMAL LOW (ref 98–111)
Creatinine, Ser: 0.8 mg/dL (ref 0.44–1.00)
Creatinine, Ser: 0.77 mg/dL (ref 0.44–1.00)
GFR calc Af Amer: >60 mL/min (ref >60)
GFR calc Af Amer: >60 mL/min (ref >60)
GFR calc non Af Amer: >60 mL/min (ref >60)
Glucose, Bld: 158 mg/dL — ABNORMAL HIGH (ref 65–99)
Glucose, Bld: 165 mg/dL — ABNORMAL HIGH (ref 70–99)
POTASSIUM: 3.9 mmol/L (ref 3.5–5.1)
POTASSIUM: 4 mmol/L (ref 3.5–5.1)
SODIUM: 136 mmol/L (ref 135–145)
SODIUM: 138 mmol/L (ref 135–145)

## 2018-01-14 LAB — TROPONIN I: Troponin I: 0.1 ng/mL (ref <0.03)

## 2018-01-14 LAB — CBC
HEMATOCRIT: 38.2 % (ref 36.0–46.0)
Hemoglobin: 11.8 g/dL — ABNORMAL LOW (ref 12.0–15.0)
MCH: 28.6 pg (ref 26.0–34.0)
MCHC: 30.9 g/dL (ref 30.0–36.0)
MCV: 92.7 fL (ref 78.0–100.0)
Platelets: 357 10*3/uL (ref 150–400)
RBC: 4.12 MIL/uL (ref 3.87–5.11)
RDW: 14.8 % (ref 11.5–15.5)
WBC: 15.9 10*3/uL — ABNORMAL HIGH (ref 4.0–10.5)

## 2018-01-14 LAB — RESPIRATORY PANEL BY PCR
Adenovirus: NOT DETECTED
BORDETELLA PERTUSSIS-RVPCR: NOT DETECTED
CHLAMYDOPHILA PNEUMONIAE-RVPPCR: NOT DETECTED
CORONAVIRUS HKU1-RVPPCR: NOT DETECTED
Coronavirus 229E: NOT DETECTED
Coronavirus NL63: NOT DETECTED
Coronavirus OC43: NOT DETECTED
INFLUENZA B-RVPPCR: NOT DETECTED
Influenza A: NOT DETECTED
Metapneumovirus: NOT DETECTED
Mycoplasma pneumoniae: NOT DETECTED
PARAINFLUENZA VIRUS 3-RVPPCR: NOT DETECTED
Parainfluenza Virus 1: NOT DETECTED
Parainfluenza Virus 2: NOT DETECTED
Parainfluenza Virus 4: NOT DETECTED
RHINOVIRUS / ENTEROVIRUS - RVPPCR: NOT DETECTED
Respiratory Syncytial Virus: NOT DETECTED

## 2018-01-14 LAB — GLUCOSE, CAPILLARY
GLUCOSE-CAPILLARY: 151 mg/dL — AB (ref 70–99)
Glucose-Capillary: 149 mg/dL — ABNORMAL HIGH (ref 70–99)
Glucose-Capillary: 163 mg/dL — ABNORMAL HIGH (ref 70–99)
Glucose-Capillary: 165 mg/dL — ABNORMAL HIGH (ref 70–99)

## 2018-01-14 LAB — RAPID URINE DRUG SCREEN, HOSP PERFORMED
Amphetamines: NOT DETECTED
BENZODIAZEPINES: POSITIVE — AB
COCAINE: NOT DETECTED
Opiates: NOT DETECTED
Tetrahydrocannabinol: NOT DETECTED

## 2018-01-14 LAB — ECHOCARDIOGRAM COMPLETE
HEIGHTINCHES: 64 in
Weight: 3252.23 oz

## 2018-01-14 LAB — MAGNESIUM: MAGNESIUM: 2.3 mg/dL (ref 1.7–2.4)

## 2018-01-14 LAB — HEPATIC FUNCTION PANEL
ALBUMIN: 3.2 g/dL — AB (ref 3.5–5.0)
ALK PHOS: 178 U/L — AB (ref 38–126)
ALT: 29 U/L (ref 14–54)
AST: 15 U/L (ref 15–41)
Bilirubin, Direct: 0.1 mg/dL (ref 0.1–0.5)
Indirect Bilirubin: 0.4 mg/dL (ref 0.3–0.9)
TOTAL PROTEIN: 7.5 g/dL (ref 6.5–8.1)
Total Bilirubin: 0.5 mg/dL (ref 0.3–1.2)

## 2018-01-14 LAB — HIV ANTIBODY (ROUTINE TESTING W REFLEX): HIV SCREEN 4TH GENERATION: NONREACTIVE

## 2018-01-14 LAB — MRSA PCR SCREENING: MRSA by PCR: NEGATIVE

## 2018-01-14 LAB — PROCALCITONIN
PROCALCITONIN: 0.16 ng/mL
PROCALCITONIN: 0.11 ng/mL

## 2018-01-14 LAB — LACTIC ACID, PLASMA: Lactic Acid, Venous: 0.9 mmol/L (ref 0.5–1.9)

## 2018-01-14 LAB — PHOSPHORUS: PHOSPHORUS: 3.8 mg/dL (ref 2.5–4.6)

## 2018-01-14 LAB — BRAIN NATRIURETIC PEPTIDE: B NATRIURETIC PEPTIDE 5: 225.5 pg/mL — AB (ref 0.0–100.0)

## 2018-01-14 LAB — STREP PNEUMONIAE URINARY ANTIGEN: STREP PNEUMO URINARY ANTIGEN: NEGATIVE

## 2018-01-14 MED ORDER — FLUCONAZOLE 100 MG PO TABS
100.0000 mg | ORAL_TABLET | Freq: Every day | ORAL | Status: DC
  Administered 2018-01-14 – 2018-01-18 (×5): 100 mg via ORAL
  Filled 2018-01-14 (×5): qty 1

## 2018-01-14 MED ORDER — PANTOPRAZOLE SODIUM 40 MG PO TBEC
40.0000 mg | DELAYED_RELEASE_TABLET | Freq: Every day | ORAL | Status: DC
  Administered 2018-01-14: 40 mg via ORAL
  Filled 2018-01-14: qty 1

## 2018-01-14 MED ORDER — IPRATROPIUM-ALBUTEROL 0.5-2.5 (3) MG/3ML IN SOLN
3.0000 mL | RESPIRATORY_TRACT | Status: DC
  Administered 2018-01-14 – 2018-01-16 (×12): 3 mL via RESPIRATORY_TRACT
  Filled 2018-01-14 (×12): qty 3

## 2018-01-14 MED ORDER — ORAL CARE MOUTH RINSE
15.0000 mL | Freq: Two times a day (BID) | OROMUCOSAL | Status: DC
  Administered 2018-01-14 – 2018-01-15 (×4): 15 mL via OROMUCOSAL

## 2018-01-14 MED ORDER — CHLORHEXIDINE GLUCONATE 0.12 % MT SOLN
15.0000 mL | Freq: Two times a day (BID) | OROMUCOSAL | Status: DC
  Administered 2018-01-14 – 2018-01-15 (×3): 15 mL via OROMUCOSAL
  Filled 2018-01-14: qty 15

## 2018-01-14 MED ORDER — NYSTATIN 100000 UNIT/ML MT SUSP
5.0000 mL | Freq: Four times a day (QID) | OROMUCOSAL | Status: DC
  Administered 2018-01-14 – 2018-01-18 (×18): 500000 [IU] via ORAL
  Filled 2018-01-14 (×19): qty 5

## 2018-01-14 MED ORDER — SODIUM CHLORIDE 0.9 % IV SOLN
INTRAVENOUS | Status: DC
  Administered 2018-01-14: 04:00:00 via INTRAVENOUS

## 2018-01-14 MED ORDER — FUROSEMIDE 10 MG/ML IJ SOLN
40.0000 mg | Freq: Once | INTRAMUSCULAR | Status: AC
  Administered 2018-01-14: 40 mg via INTRAVENOUS
  Filled 2018-01-14: qty 4

## 2018-01-14 NOTE — Progress Notes (Addendum)
PULMONARY / CRITICAL CARE MEDICINE  Name: Sandra Ramsey MRN: [id number] DOB: Jul 11, 1966    ADMISSION DATE:  01/13/2018 CONSULTATION DATE:  01/13/18  REFERRING MD :  Hal Hope  CHIEF COMPLAINT:  SOB   HISTORY OF PRESENT ILLNESS:  Sandra Ramsey is a 52 y.o. female with a PMH as outlined below.  She presented to [facility] 01/13/18 with SOB and productive cough that had worsened over the week prior.  She has apparently had a cough productive of green and black colored sputum and has been seen and placed on multiple abx for bronchitis / PNA; however, symptoms didn't improve much.  In ED, she had increased WOB and was hypoxic and required 5L [place] to maintain sats in 90s.  It was felt that she had AECOPD vs PNA vs CHF exacerbation.  CXR showed some edema and BNP was 2000; therefore, she received 20mg  lasix.  Later had increased WOB again so VBG was obtained which showed hypercapnia (7.3 / 61 / 91).  She was started on BiPAP and settings were increased to 18/5.  She was later transferred to [facility] under the [facility] service.  After arrival to [facility], she had continued mild increased WOB; therefore, PCCM was asked to see in consultation.  On our evaluation, she is on BiPAP at [place] but has significant air leak (as high as 125L).  Mask was adjusted and settings were increased to 20/8.  Pt denies any dyspnea at this time (however, she does appear to have some dyspnea).  She has pleuritic chest pain but denies chest pain at rest.  Denies fevers/chills/sweats, N/V/D, abd pain, exposures to known sick contacts, recent travel.  Denies any recent hospital admission.  She does feel better compared to when she first arrived at [facility].  She does tell us that she uses O2 at home though mainly on a PRN basis (up to 5L).  Also tells [place] that she takes prednisone on PRN basis.   ADDENDUM 2355:  Called back to bedside due to pt being more difficult to arouse.  Pt hypersomnolent and is more difficult to arouse; however,  once aroused, she is appropriately answering questions and following commands.  ABG with hypoxemia and hypercapnia (7.33 / 61 / 72).  Nursing staff very concerned and report that pt has gradually gotten more lethargic since arriving to the floor.   SUBJECTIVE: Very short of breath.  Frustrated because she has not had her "home meds"   VITAL SIGNS: Temp:  [97.9 F (36.6 C)-98.1 F (36.7 C)] 98.1 F (36.7 C) (06/25 0720) Pulse Rate:  [92-135] 94 (06/25 0600) Resp:  [21-33] 24 (06/25 0600) BP: (113-143)/(68-87) 113/69 (06/25 0600) SpO2:  [92 %-98 %] 97 % (06/25 0823) FiO2 (%):  [35 %] 35 % (06/25 0823) Weight:  [201 lb 15.1 oz (91.6 kg)-203 lb 4.2 oz (92.2 kg)] 203 lb 4.2 oz (92.2 kg) (06/25 0000)  PHYSICAL EXAMINATION: General: This is an acutely ill-appearing 52 year old female she is currently off BiPAP, exhibiting accessory use and escalated work of breathing HEENT normocephalic atraumatic mucous membranes are moist.  There is no jugular venous distention to she does have upper airway wheezing Pulmonary: Diffuse rales and wheezes, positive accessory use tachypneic, increased work of breathing. Cardiac tachycardic regular rate and rhythm Abdomen: Soft nontender Extremities: Dependent lower extremity edema, otherwise brisk cap refill strong pulses Neuro: Awake, anxious, moves all extremities, no focal deficits   Recent Labs  Lab 01/13/18 2310 01/14/18 0328  NA 136 138  K 3.9 4.0  CL 93* 96*  CO2 33* 32  BUN 6 9  CREATININE 0.80 0.77  GLUCOSE 158* 165*   Recent Labs  Lab 01/13/18 2310 01/14/18 0328  HGB 12.5 11.8*  HCT 40.6 38.2  WBC 17.5* 15.9*  PLT 338 357   ABG    Component Value Date/Time   PHART 7.359 01/14/2018 0300   PCO2ART 59.8 (H) 01/14/2018 0300   PO2ART 74.0 (L) 01/14/2018 0300   HCO3 33.8 (H) 01/14/2018 0300   TCO2 36 (H) 01/14/2018 0300   O2SAT 94.0 01/14/2018 0300   Dg Chest Port 1 View  Result Date: 01/13/2018 CLINICAL DATA:  Dyspnea, cough  EXAM: PORTABLE CHEST 1 VIEW COMPARISON:  None. FINDINGS: Normal heart size. Normal mediastinal contour. No pneumothorax. No pleural effusion. Patchy faintly nodular opacities throughout both lungs, most prominent in the lower lungs. IMPRESSION: Patchy faintly nodular opacities throughout both lungs, most prominent in the lower lungs. Per epic, the patient has a remote history of breast and ovarian cancer. Chest CT recommended for further characterization of these opacities. Electronically Signed   By: Ilona Sorrel M.D.   On: 01/13/2018 22:10    STUDIES:  CXR 6/24 > faint nodular opacities in lower lungs.  SIGNIFICANT EVENTS  6/24 > admit, PCCM consult.  ASSESSMENT / PLAN:  Acute on chronic hypoxemic and hypercarbic respiratory failure in the setting of presumed community-acquired pneumonia (suspect viral), with acute exacerbation of underlying chronic obstructive pulmonary disease superimposed on underlying moderate to severe chronic obstructive pulmonary disease complicated by Oral candidiasis and on-going upper airway irritation  -Cannot exclude element of pulmonary edema reported BNP 2000 -Procalcitonin not impressive -Portable chest x-ray personally reviewed: Bibasilar right greater than left airspace disease -Bedside echocardiogram obtained at [facility], notes ejection fraction "normal with no focal wall motion abnormality. -she completed several days of broad spec abx prior so suspect this is NOT bacterial process.  Plan Wean oxygen; continue BiPAP PRN Continue scheduled bronchodilators Day #2 azithromycin and ceftriaxone Continue Solu-Medrol at 60 every 6 Lasix x1 N.p.o. with the exception of sips of clears with  medications Mobilize Trend procalcitonin Treat thrush Repeat chest x-ray 6/26 Follow-up respiratory viral panel, send urine strep and Legionella antigen Continue smoking cessation counseling Repeat formal echocardiogram  Pulmonary nodules -Had remote history of breast  and ovarian cancer Plan Will need follow-up CT imaging as outpatient  Acute metabolic encephalopathy -Positive benzodiazepines on UDS; her PCO2 was around 60.  Apparently she was initially awake with this arterial blood gas so unclear if this explains the encephalopathy. Plan Supportive care  Hyperglycemia Plan Sliding scale insulin  DVT prophylaxis: LMWH SUP: PPI  Diet: sips w/ pills Activity: BR Disposition : ICU; high risk for intubation  52 year old female, active smoker, presumptive diagnosis of moderate to severe COPD presents with what appears to be acute on chronic respiratory failure due to Community-acquired pneumonia (? Viral), complicated by AECOPD, with bibasilar airspace disease cannot exclude early acute lung injury versus pulmonary edema component.  Still very borderline pulmonary status with high risk for intubation  Erick Colace ACNP-BC [place] Pager # [phone] OR # [phone] if no answer

## 2018-01-14 NOTE — Progress Notes (Signed)
Pt arrived onto the unit at approximately 2130 01/13/18. Pt was on BiPAP and continuing to have an increased WOB per the transport team. Pt was alert and oriented x 4 and vocalized a feeling of being short of breath.   Pt was admitted and Dr. Hal Hope came to the bedside to assess the pt. The pt was able answer his questions appropriately. An ABG was drawn and the pt's pCO2 was elevated at 65.7. Dr. Hal Hope was still on the unit and was notified.  PCCM was consulted by Dr. Hal Hope.  The pt began to become more lethargic and stated that they felt like their blood sugar was low. A CBG was drawn, their CBG was 155.   PCCM was on unit to assess pt at approximately 2230 01/13/18, Dr. Jimmey Ralph and PA Shearon Stalls were told by this nurse that the pt had a change in mental status and that the pt was becoming increasingly more lethargic. PCCM placed new orders for the pt and this nurse was instructed to inform them if there were any concerns about the pt.  PCCM was called via [place] at 2345 01/13/18 regarding continued change in the pt's mental status. The pt was unresponsive except to extreme noxious stimuli. A repeat ABG was drawn with the pCO2 now 61.4, however this was not reflected in the pt's mental status. PCCM came up to the unit to assess the pt again and the decision was made to transfer the pt to 2M13.  Hand off report was called to Jearld Adjutant, RN and the pt was transferred at 0000 01/14/18.

## 2018-01-14 NOTE — Care Management Note (Signed)
Case Management Note  Patient Details  Name: Sandra Ramsey MRN: [id number] Date of Birth: [birth year]  Subjective/Objective:                    Action/Plan:  PTA independent from home.  Pt has PCP with [facility] and gets her prescriptions filled at the pharmacy at the clinic.  CM will continue to follow for discharge needs   Expected Discharge Date:  01/16/18               Expected Discharge Plan:  Home/Self Care  In-House Referral:     Discharge planning Services     Post Acute Care Choice:    Choice offered to:     DME Arranged:    DME Agency:     HH Arranged:    HH Agency:     Status of Service:     If discussed at [employer], dates discussed:    Additional Comments:  Maryclare Labrador, RN 01/14/2018, 2:16 PM

## 2018-01-14 NOTE — Progress Notes (Signed)
Patient was transported to 2M13. Patient tolerated well and is resting on her ordered settings at this time.

## 2018-01-14 NOTE — Progress Notes (Signed)
  Echocardiogram 2D Echocardiogram has been performed.  Merrie Roof F 01/14/2018, 11:43 AM

## 2018-01-15 ENCOUNTER — Inpatient Hospital Stay (HOSPITAL_COMMUNITY): Payer: Self-pay

## 2018-01-15 LAB — GLUCOSE, CAPILLARY
GLUCOSE-CAPILLARY: 152 mg/dL — AB (ref 70–99)
GLUCOSE-CAPILLARY: 156 mg/dL — AB (ref 70–99)
GLUCOSE-CAPILLARY: 175 mg/dL — AB (ref 70–99)
GLUCOSE-CAPILLARY: 231 mg/dL — AB (ref 70–99)
Glucose-Capillary: 117 mg/dL — ABNORMAL HIGH (ref 70–99)

## 2018-01-15 LAB — PROCALCITONIN: PROCALCITONIN: 0.12 ng/mL

## 2018-01-15 LAB — LEGIONELLA PNEUMOPHILA SEROGP 1 UR AG: L. PNEUMOPHILA SEROGP 1 UR AG: NEGATIVE

## 2018-01-15 MED ORDER — FUROSEMIDE 10 MG/ML IJ SOLN
40.0000 mg | Freq: Once | INTRAMUSCULAR | Status: AC
  Administered 2018-01-15: 40 mg via INTRAVENOUS
  Filled 2018-01-15: qty 4

## 2018-01-15 MED ORDER — METHYLPREDNISOLONE SODIUM SUCC 125 MG IJ SOLR
40.0000 mg | Freq: Two times a day (BID) | INTRAMUSCULAR | Status: DC
  Administered 2018-01-15 – 2018-01-17 (×4): 40 mg via INTRAVENOUS
  Filled 2018-01-15 (×4): qty 2

## 2018-01-15 MED ORDER — ENSURE ENLIVE PO LIQD
237.0000 mL | Freq: Two times a day (BID) | ORAL | Status: DC
  Administered 2018-01-16 – 2018-01-18 (×6): 237 mL via ORAL

## 2018-01-15 MED ORDER — PANTOPRAZOLE SODIUM 40 MG PO TBEC
40.0000 mg | DELAYED_RELEASE_TABLET | Freq: Two times a day (BID) | ORAL | Status: DC
  Administered 2018-01-15 – 2018-01-18 (×7): 40 mg via ORAL
  Filled 2018-01-15 (×7): qty 1

## 2018-01-15 MED ORDER — INSULIN ASPART 100 UNIT/ML ~~LOC~~ SOLN
0.0000 [IU] | Freq: Three times a day (TID) | SUBCUTANEOUS | Status: DC
  Administered 2018-01-15: 3 [IU] via SUBCUTANEOUS
  Administered 2018-01-16: 2 [IU] via SUBCUTANEOUS
  Administered 2018-01-16: 3 [IU] via SUBCUTANEOUS
  Administered 2018-01-16: 5 [IU] via SUBCUTANEOUS
  Administered 2018-01-17: 2 [IU] via SUBCUTANEOUS
  Administered 2018-01-17 (×2): 3 [IU] via SUBCUTANEOUS
  Administered 2018-01-18 (×2): 2 [IU] via SUBCUTANEOUS

## 2018-01-15 MED ORDER — POTASSIUM CHLORIDE CRYS ER 20 MEQ PO TBCR
40.0000 meq | EXTENDED_RELEASE_TABLET | Freq: Once | ORAL | Status: AC
Start: 2018-01-15 — End: 2018-01-15
  Administered 2018-01-15: 40 meq via ORAL
  Filled 2018-01-15: qty 2

## 2018-01-15 MED ORDER — INSULIN ASPART 100 UNIT/ML ~~LOC~~ SOLN
0.0000 [IU] | Freq: Every day | SUBCUTANEOUS | Status: DC
  Administered 2018-01-15: 2 [IU] via SUBCUTANEOUS

## 2018-01-15 NOTE — Progress Notes (Signed)
PULMONARY / CRITICAL CARE MEDICINE  Name: Jadene Stemmer MRN: [id number] DOB: 1966/06/21    ADMISSION DATE:  01/13/2018 CONSULTATION DATE:  01/13/18  REFERRING MD :  Hal Hope  CHIEF COMPLAINT:  SOB   HISTORY OF PRESENT ILLNESS:  Sandra Ramsey is a 52 y.o. female with a PMH as outlined below.  She presented to [facility] 01/13/18 with SOB and productive cough that had worsened over the week prior.  She has apparently had a cough productive of green and black colored sputum and has been seen and placed on multiple abx for bronchitis / PNA; however, symptoms didn't improve much.  In ED, she had increased WOB and was hypoxic and required 5L [place] to maintain sats in 90s.  It was felt that she had AECOPD vs PNA vs CHF exacerbation.  CXR showed some edema and BNP was 2000; therefore, she received 20mg  lasix.  Later had increased WOB again so VBG was obtained which showed hypercapnia (7.3 / 61 / 91).  She was started on BiPAP and settings were increased to 18/5.  She was later transferred to [facility] under the [facility] service.  After arrival to [facility], she had continued mild increased WOB; therefore, PCCM was asked to see in consultation.  On our evaluation, she is on BiPAP at [place] but has significant air leak (as high as 125L).  Mask was adjusted and settings were increased to 20/8.  Pt denies any dyspnea at this time (however, she does appear to have some dyspnea).  She has pleuritic chest pain but denies chest pain at rest.  Denies fevers/chills/sweats, N/V/D, abd pain, exposures to known sick contacts, recent travel.  Denies any recent hospital admission.  She does feel better compared to when she first arrived at [facility].  She does tell us that she uses O2 at home though mainly on a PRN basis (up to 5L).  Also tells [place] that she takes prednisone on PRN basis.   ADDENDUM 2355:  Called back to bedside due to pt being more difficult to arouse.  Pt hypersomnolent and is more difficult to arouse; however,  once aroused, she is appropriately answering questions and following commands.  ABG with hypoxemia and hypercapnia (7.33 / 61 / 72).  Nursing staff very concerned and report that pt has gradually gotten more lethargic since arriving to the floor.   SUBJECTIVE:   Feeling better.  Wants to eat. VITAL SIGNS: Temp:  [97.9 F (36.6 C)-98.3 F (36.8 C)] 97.9 F (36.6 C) (06/26 0815) Pulse Rate:  [79-116] 93 (06/26 0800) Resp:  [20-37] 25 (06/26 0800) BP: (84-159)/(56-95) 125/61 (06/26 0800) SpO2:  [92 %-100 %] 100 % (06/26 0800) FiO2 (%):  [35 %] 35 % (06/25 1536) Weight:  [201 lb 15.1 oz (91.6 kg)] 201 lb 15.1 oz (91.6 kg) (06/26 0400) 6 L via nasal cannula PHYSICAL EXAMINATION: General: Is a 52 year old white female she is currently sitting up in bed.  Her work of breathing is remarkably improved.  She is a bit argumentative today, wants to eat. HEENT normocephalic atraumatic.  Mucous membranes still erythemic with a white blotchy areas in her posterior pharynx her phonation quality has improved some but still her voice is quite raspy there is no jugular venous distention Pulmonary: Diffuse wheeze much of which is translocated from the upper airway accessory muscle use is improved Cardiac: Regular rate and rhythm Abdomen: Soft, nontender, no organomegaly, positive bowel sounds. Extremities/musculoskeletal: Warm and dry, brisk capillary refill Neuro: Awake oriented no focal deficits  Recent Labs  Lab 01/13/18 2310 01/14/18 0328  NA 136 138  K 3.9 4.0  CL 93* 96*  CO2 33* 32  BUN 6 9  CREATININE 0.80 0.77  GLUCOSE 158* 165*   Recent Labs  Lab 01/13/18 2310 01/14/18 0328  HGB 12.5 11.8*  HCT 40.6 38.2  WBC 17.5* 15.9*  PLT 338 357   ABG    Component Value Date/Time   PHART 7.359 01/14/2018 0300   PCO2ART 59.8 (H) 01/14/2018 0300   PO2ART 74.0 (L) 01/14/2018 0300   HCO3 33.8 (H) 01/14/2018 0300   TCO2 36 (H) 01/14/2018 0300   O2SAT 94.0 01/14/2018 0300   Dg Chest  Port 1 View  Result Date: 01/15/2018 CLINICAL DATA:  Follow-up pneumonia EXAM: PORTABLE CHEST 1 VIEW COMPARISON:  01/13/2018 FINDINGS: Cardiac shadow is stable. The lungs are well aerated bilaterally. The nodular changes are less prominent on the current exam. New right basilar infiltrate is noted. No bony abnormality is seen. IMPRESSION: New right basilar infiltrate. The previously seen nodular changes are less well appreciated. Electronically Signed   By: Inez Catalina M.D.   On: 01/15/2018 07:15   Dg Chest Port 1 View  Result Date: 01/13/2018 CLINICAL DATA:  Dyspnea, cough EXAM: PORTABLE CHEST 1 VIEW COMPARISON:  None. FINDINGS: Normal heart size. Normal mediastinal contour. No pneumothorax. No pleural effusion. Patchy faintly nodular opacities throughout both lungs, most prominent in the lower lungs. IMPRESSION: Patchy faintly nodular opacities throughout both lungs, most prominent in the lower lungs. Per epic, the patient has a remote history of breast and ovarian cancer. Chest CT recommended for further characterization of these opacities. Electronically Signed   By: Ilona Sorrel M.D.   On: 01/13/2018 22:10    STUDIES:  CXR 6/24 > faint nodular opacities in lower lungs.  SIGNIFICANT EVENTS  6/24 > admit, PCCM consult.  ASSESSMENT / PLAN:  Acute on chronic hypoxemic and hypercarbic respiratory failure in the setting of presumed community-acquired pneumonia (suspect viral), with acute exacerbation of underlying chronic obstructive pulmonary disease superimposed on underlying moderate to severe chronic obstructive pulmonary disease complicated by Oral candidiasis and on-going upper airway irritation  -Portable chest x-ray continues to demonstrate bibasilar airspace disease.  Perhaps a little more consolidated in the right base today.  Aeration essentially unchanged otherwise. -Off from noninvasive ventilation all night, currently breathing more comfortably. -Phonation quality improved, vocal  hoarseness improved. -I believe upper airway irritation symptom burden has been a major contributing factor to her symptom burden -Echocardiogram was negative.  Her EF was intact, there is no left ventricular wall abnormality.  She had only grade 1 diastolic dysfunction. Plan Discontinue BiPAP Wean oxygen Continue scheduled bronchodilators Day #3 antibiotics, will narrow to ceftriaxone only.  She completed 7 days as an outpatient on the levofloxacin, and had 3 days of azithromycin prior to that.  She has had adequate atypical coverage Decrease Solu-Medrol to 40 mg every 12 hours Repeating Lasix again today Incentive spirometry Continue Diflucan and nystatin swish and swallow.  Would treat thrush for at least 10 days Smoking cessation Okay for transfer out of the intensive care At time of discharge I would move her away from healed powders such as Advair.  We discharge her to home on a maintenance regimen of Spiriva and Dulera.  PRN albuterol, prednisone taper, and oxygen if needed. She has an appointment already made with pulmonary from [facility] satellite program  Pulmonary nodules -Had remote history of breast and ovarian cancer Plan Will need follow-up  CT imaging as an outpatient  Acute metabolic encephalopathy -Positive benzodiazepines on UDS; her PCO2 was around 60.  She is currently awake, somewhat impulsive and at times argumentative.  Unclear what baseline is however currently awake and oriented and appropriate I wonder if high-dose steroids may be contributing to current argumentative affect Plan Limit sedation Decrease steroids Continue supportive care  Hyperglycemia Plan Changing sliding scale to [facility] and at bedtime  DVT prophylaxis: LMWH SUP: PPI  Diet: sips w/ pills; advancing to regular Activity: BR Disposition : ICU; high risk for intubation  Erick Colace ACNP-BC [place] Pager # [phone] OR # [phone] if no answer

## 2018-01-15 NOTE — Progress Notes (Signed)
Initial Nutrition Assessment  DOCUMENTATION CODES:   Obesity unspecified  INTERVENTION:   Ensure Enlive po BID, each supplement provides 350 kcal and 20 grams of protein   NUTRITION DIAGNOSIS:   Inadequate oral intake related to decreased appetite(increased WOB) as evidenced by meal completion < 50%.  GOAL:   Patient will meet greater than or equal to 90% of their needs  MONITOR:   PO intake, Supplement acceptance  REASON FOR ASSESSMENT:   Malnutrition Screening Tool    ASSESSMENT:   Pt with PMH of COPD, chronic hypoxia on no home O2 per pt, active smoker, CVA, ovarian and breast ca admitted from outside hospital with SOB and increased WOB due to AECOPD vs PNA vs CHF exacerbation.    Per pt she has a good appetite at home but feels it is somewhat decreased since admission, states she has lost a few pounds PTA. She reports having recently started a new job and got walking PNA. She believes this is the reason for weight loss. She was able to eat about 1/2 of her breakfast and is no longer on bipap but [place] only. She has noticeable increased WOB during visit and must stop to catch her breath as we talk.   Medications reviewed and include: SSI with meals and bedtime, solumedrol Labs reviewed:  CBG (last 3)  Recent Labs    01/15/18 0001 01/15/18 0552 01/15/18 1216  GLUCAP 156* 152* 175*      NUTRITION - FOCUSED PHYSICAL EXAM:    Most Recent Value  Orbital Region  No depletion  Upper Arm Region  No depletion  Thoracic and Lumbar Region  No depletion  Buccal Region  No depletion  Temple Region  No depletion  Clavicle Bone Region  No depletion  Clavicle and Acromion Bone Region  No depletion  Scapular Bone Region  No depletion  Dorsal Hand  No depletion  Patellar Region  No depletion  Anterior Thigh Region  No depletion  Posterior Calf Region  No depletion  Edema (RD Assessment)  None  Hair  Reviewed  Eyes  Reviewed  Mouth  Reviewed  Skin  Reviewed  Nails   Reviewed       Diet Order:   Diet Order           Diet regular Room service appropriate? Yes; Fluid consistency: Thin  Diet effective now          EDUCATION NEEDS:   Education needs have been addressed  Skin:  Skin Assessment: Reviewed RN Assessment  Last BM:  6/26 medium  Height:   Ht Readings from Last 1 Encounters:  01/14/18 5\' 4"  (1.626 m)    Weight:   Wt Readings from Last 1 Encounters:  01/15/18 201 lb 15.1 oz (91.6 kg)    Ideal Body Weight:  54.5 kg  BMI:  Body mass index is 34.66 kg/m.  Estimated Nutritional Needs:   Kcal:  1800-2100  Protein:  95-115 grams  Fluid:  > 1.8 L/day  Maylon Peppers RD, LDN, CNSC [phone] Pager [phone] After Hours Pager

## 2018-01-16 DIAGNOSIS — J181 Lobar pneumonia, unspecified organism: Secondary | ICD-10-CM

## 2018-01-16 DIAGNOSIS — Z720 Tobacco use: Secondary | ICD-10-CM

## 2018-01-16 LAB — COMPREHENSIVE METABOLIC PANEL
ALBUMIN: 3.2 g/dL — AB (ref 3.5–5.0)
ALK PHOS: 138 U/L — AB (ref 38–126)
ALT: 36 U/L (ref 0–44)
AST: 19 U/L (ref 15–41)
Anion gap: 10 (ref 5–15)
BILIRUBIN TOTAL: 0.3 mg/dL (ref 0.3–1.2)
BUN: 25 mg/dL — AB (ref 6–20)
CALCIUM: 8.6 mg/dL — AB (ref 8.9–10.3)
CO2: 36 mmol/L — ABNORMAL HIGH (ref 22–32)
Chloride: 90 mmol/L — ABNORMAL LOW (ref 98–111)
Creatinine, Ser: 0.82 mg/dL (ref 0.44–1.00)
GFR calc Af Amer: >60 mL/min (ref >60)
Glucose, Bld: 182 mg/dL — ABNORMAL HIGH (ref 70–99)
POTASSIUM: 4.6 mmol/L (ref 3.5–5.1)
Sodium: 136 mmol/L (ref 135–145)
Total Protein: 6.9 g/dL (ref 6.5–8.1)

## 2018-01-16 LAB — GLUCOSE, CAPILLARY
GLUCOSE-CAPILLARY: 156 mg/dL — AB (ref 70–99)
GLUCOSE-CAPILLARY: 156 mg/dL — AB (ref 70–99)
Glucose-Capillary: 128 mg/dL — ABNORMAL HIGH (ref 70–99)

## 2018-01-16 LAB — PROCALCITONIN: Procalcitonin: <0.1 ng/mL

## 2018-01-16 MED ORDER — GUAIFENESIN 100 MG/5ML PO SOLN: 5.0000 mL | ORAL | Status: DC | PRN

## 2018-01-16 MED ORDER — IPRATROPIUM-ALBUTEROL 0.5-2.5 (3) MG/3ML IN SOLN
3.0000 mL | Freq: Four times a day (QID) | RESPIRATORY_TRACT | Status: DC
  Administered 2018-01-16 – 2018-01-18 (×7): 3 mL via RESPIRATORY_TRACT
  Filled 2018-01-16 (×7): qty 3

## 2018-01-16 MED ORDER — SALINE SPRAY 0.65 % NA SOLN
1.0000 | NASAL | Status: DC | PRN
  Administered 2018-01-16: 1 via NASAL
  Filled 2018-01-16: qty 44

## 2018-01-16 NOTE — Progress Notes (Signed)
[facility] PROGRESS NOTE  Sandra Ramsey OZH:[id number] DOB: 12-Jun-1966 DOA: 01/13/2018  PCP: No primary care provider on file.  Brief History/Interval Summary: 52 year old Caucasian female with past medical history of COPD who unfortunately continues to smoke presented with shortness of breath.  Was thought to have COPD exacerbation.  There was also concern for pneumonia.  Patient was hospitalized placed on BiPAP.  She continued to have some worsening in her respiratory status.  She was transferred to the ICU.  Did not require intubation.  She was stabilized and then transferred to the floor.  Reason for Visit: Acute COPD exacerbation  Consultants: Pulmonology  Procedures:   Required BiPAP  Transthoracic echocardiogram Study Conclusions  - Left ventricle: The cavity size was normal. Wall thickness was   normal. Systolic function was normal. The estimated ejection   fraction was in the range of 60% to 65%. Wall motion was normal;   there were no regional wall motion abnormalities. Doppler   parameters are consistent with abnormal left ventricular   relaxation (grade 1 diastolic dysfunction).  Antibiotics: Currently on ceftriaxone  Subjective/Interval History: Patient continues to have shortness of breath.  She had a cough spell this morning.  Denies any chest pain.  ROS: Denies any headaches  Objective:  Vital Signs  Vitals:   01/16/18 0530 01/16/18 0800 01/16/18 0818 01/16/18 0824  BP: (!) 116/41 (!) 143/72    Pulse: 89 96    Resp: 20 20    Temp: 97.7 F (36.5 C) 97.8 F (36.6 C)    TempSrc: Oral Oral    SpO2: 93% 91% 93% 94%  Weight:      Height:        Intake/Output Summary (Last 24 hours) at 01/16/2018 1158 Last data filed at 01/16/2018 1019 Gross per 24 hour  Intake 0 ml  Output 200 ml  Net -200 ml   Filed Weights   01/13/18 2312 01/14/18 0000 01/15/18 0400  Weight: 91.6 kg (201 lb 15.1 oz) 92.2 kg (203 lb 4.2 oz) 91.6 kg (201 lb 15.1 oz)     General appearance: alert, cooperative, appears stated age and no distress Head: Normocephalic, without obvious abnormality, atraumatic Resp: Mildly tachypneic at rest.  No use of accessory muscles.  Coarse breath sounds bilaterally.  No obvious wheezing or rhonchi or crackles. Cardio: regular rate and rhythm, S1, S2 normal, no murmur, click, rub or gallop GI: soft, non-tender; bowel sounds normal; no masses,  no organomegaly Extremities: extremities normal, atraumatic, no cyanosis or edema Neurologic: Mildly distracted.  No focal neurological deficits noted on examination.  Lab Results:  Data Reviewed: I have personally reviewed following labs and imaging studies  CBC: Recent Labs  Lab 01/13/18 2310 01/14/18 0328  WBC 17.5* 15.9*  NEUTROABS 14.9*  --   HGB 12.5 11.8*  HCT 40.6 38.2  MCV 92.3 92.7  PLT 338 629    Basic Metabolic Panel: Recent Labs  Lab 01/13/18 2310 01/14/18 0328 01/16/18 0410  NA 136 138 136  K 3.9 4.0 4.6  CL 93* 96* 90*  CO2 33* 32 36*  GLUCOSE 158* 165* 182*  BUN 6 9 25*  CREATININE 0.80 0.77 0.82  CALCIUM 9.1 8.8* 8.6*  MG  --  2.3  --   PHOS  --  3.8  --     GFR: Estimated Creatinine Clearance: 86.1 mL/min (by C-G formula based on SCr of 0.82 mg/dL).  Liver Function Tests: Recent Labs  Lab 01/13/18 2310 01/16/18 0410  AST 15 19  ALT 29 36  ALKPHOS 178* 138*  BILITOT 0.5 0.3  PROT 7.5 6.9  ALBUMIN 3.2* 3.2*    Cardiac Enzymes: Recent Labs  Lab 01/13/18 2310 01/14/18 0328 01/14/18 0900  TROPONINI 0.10* <0.03 <0.03    CBG: Recent Labs  Lab 01/15/18 0001 01/15/18 0552 01/15/18 1216 01/15/18 1717 01/15/18 2110  GLUCAP 156* 152* 175* 117* 231*     Recent Results (from the past 240 hour(s))  MRSA PCR Screening     Status: None   Collection Time: 01/13/18  9:23 PM  Result Value Ref Range Status   MRSA by PCR NEGATIVE NEGATIVE Final    Comment:        The GeneXpert MRSA Assay (FDA approved for NASAL  specimens only), is one component of a comprehensive MRSA colonization surveillance program. It is not intended to diagnose MRSA infection nor to guide or monitor treatment for MRSA infections. Performed at [facility], [place] [address], [place], [place] [address]   Culture, blood (routine x 2)     Status: None (Preliminary result)   Collection Time: 01/13/18 10:45 PM  Result Value Ref Range Status   Specimen Description BLOOD RIGHT WRIST  Final   Special Requests   Final    BOTTLES DRAWN AEROBIC ONLY Blood Culture adequate volume   Culture   Final    NO GROWTH 2 DAYS Performed at [facility], [place] [address] [place], [place] [address]    Report Status PENDING  Incomplete  Culture, blood (routine x 2)     Status: None (Preliminary result)   Collection Time: 01/13/18 11:00 PM  Result Value Ref Range Status   Specimen Description BLOOD LEFT HAND  Final   Special Requests IN PEDIATRIC BOTTLE Blood Culture adequate volume  Final   Culture   Final    NO GROWTH 2 DAYS Performed at [facility], [place] [address], [place], [place] [address]    Report Status PENDING  Incomplete  Respiratory Panel by PCR     Status: None   Collection Time: 01/14/18  4:28 AM  Result Value Ref Range Status   Adenovirus NOT DETECTED NOT DETECTED Final   Coronavirus 229E NOT DETECTED NOT DETECTED Final   Coronavirus HKU1 NOT DETECTED NOT DETECTED Final   Coronavirus NL63 NOT DETECTED NOT DETECTED Final   Coronavirus OC43 NOT DETECTED NOT DETECTED Final   Metapneumovirus NOT DETECTED NOT DETECTED Final   Rhinovirus / Enterovirus NOT DETECTED NOT DETECTED Final   Influenza A NOT DETECTED NOT DETECTED Final   Influenza B NOT DETECTED NOT DETECTED Final   Parainfluenza Virus 1 NOT DETECTED NOT DETECTED Final   Parainfluenza Virus 2 NOT DETECTED NOT DETECTED Final   Parainfluenza Virus 3 NOT DETECTED NOT DETECTED Final   Parainfluenza Virus 4 NOT DETECTED NOT DETECTED Final   Respiratory  Syncytial Virus NOT DETECTED NOT DETECTED Final   Bordetella pertussis NOT DETECTED NOT DETECTED Final   Chlamydophila pneumoniae NOT DETECTED NOT DETECTED Final   Mycoplasma pneumoniae NOT DETECTED NOT DETECTED Final    Comment: Performed at [facility], [place] [address] [place], [place] [address]      Radiology Studies: Dg Chest Port 1 View  Result Date: 01/15/2018 CLINICAL DATA:  Follow-up pneumonia EXAM: PORTABLE CHEST 1 VIEW COMPARISON:  01/13/2018 FINDINGS: Cardiac shadow is stable. The lungs are well aerated bilaterally. The nodular changes are less prominent on the current exam. New right basilar infiltrate is noted. No bony abnormality  is seen. IMPRESSION: New right basilar infiltrate. The previously seen nodular changes are less well appreciated. Electronically Signed   By: Inez Catalina M.D.   On: 01/15/2018 07:15     Medications:  Scheduled: . benzonatate  100 mg Oral TID  . budesonide (PULMICORT) nebulizer solution  0.5 mg Nebulization BID  . dextromethorphan-guaiFENesin  1 tablet Oral BID  . enoxaparin (LOVENOX) injection  40 mg Subcutaneous Q24H  . feeding supplement (ENSURE ENLIVE)  237 mL Oral BID BM  . fluconazole  100 mg Oral Daily  . insulin aspart  0-15 Units Subcutaneous TID WC  . insulin aspart  0-5 Units Subcutaneous QHS  . ipratropium-albuterol  3 mL Nebulization Q6H  . methylPREDNISolone (SOLU-MEDROL) injection  40 mg Intravenous Q12H  . nicotine  14 mg Transdermal Daily  . nystatin  5 mL Oral QID  . pantoprazole  40 mg Oral BID   Continuous: . sodium chloride Stopped (01/15/18 0746)  . cefTRIAXone (ROCEPHIN)  IV Stopped (01/16/18 0819)   EBR:[id number], sodium chloride  Assessment/Plan:    Acute on chronic hypoxemic and hypercapnic respiratory failure Secondary to COPD exacerbation and pneumonia.  She was monitored in the ICU and was on BiPAP.  She was stabilized and then transferred to the floor.  Continue to monitor oxygen saturations.   Wean down oxygen as much as possible.  Acute COPD exacerbation Patient required BiPAP in the ICU.  Did not require intubation.  Remains somewhat tachypneic.  No wheezing appreciated.  Continue nebulizer treatments, steroids, antibiotics.  As she improves we can further taper down her steroids.  As per pulmonology she will need to be discharged home on a maintenance regimen of Spiriva and Dulera.  She is not to take Advair.  She will need PRN albuterol, prednisone taper and oxygen if needed.  She has an appointment already made with pulmonary from [facility] satellite program.    Community-acquired pneumonia Has been on antibiotics recently including Levaquin.  Currently on ceftriaxone alone.  Continue for now.  Pulmonary nodules Patient apparently has a remote history of breast and ovarian cancer.  She will need outpatient CT.  Oral candidiasis Continue Diflucan and nystatin swish and swallow.  Tobacco abuse Patient unfortunately continues to smoke cigarettes.  She was counseled.  Continue nicotine patch.  Acute metabolic encephalopathy Thought to be secondary to benzodiazepine.  Mental status appears to be stable.  She has been noted to be argumentative in the ICU.  DVT Prophylaxis: Lovenox    Code Status: Full code Family Communication: Discussed with the patient Disposition Plan: Management as outlined above.  Await further improvement in pulmonary status.    LOS: 3 days   [place] [facility] Pager [phone] 01/16/2018, 11:58 AM  If 7PM-7AM, please contact night-coverage at [email or web address], password [facility]

## 2018-01-16 NOTE — Progress Notes (Signed)
eLink Physician-Brief Progress Note Patient Name: Sandra Ramsey DOB: 1965-12-26 MRN: [id number]   Date of Service  01/16/2018  HPI/Events of Note  Nasal congestion from oxygen flow via nasal cannula  eICU Interventions  I ordered saline nasal spray prn        Frederik Pear 01/16/2018, 4:04 AM

## 2018-01-17 DIAGNOSIS — J189 Pneumonia, unspecified organism: Secondary | ICD-10-CM

## 2018-01-17 LAB — GLUCOSE, CAPILLARY
GLUCOSE-CAPILLARY: 172 mg/dL — AB (ref 70–99)
GLUCOSE-CAPILLARY: 131 mg/dL — AB (ref 70–99)
Glucose-Capillary: 163 mg/dL — ABNORMAL HIGH (ref 70–99)
Glucose-Capillary: 147 mg/dL — ABNORMAL HIGH (ref 70–99)

## 2018-01-17 MED ORDER — CEFDINIR 300 MG PO CAPS
300.0000 mg | ORAL_CAPSULE | Freq: Two times a day (BID) | ORAL | Status: DC
Start: 2018-01-17 — End: 2018-01-18
  Administered 2018-01-17 – 2018-01-18 (×3): 300 mg via ORAL
  Filled 2018-01-17 (×2): qty 1
  Filled 2018-01-17: qty 4
  Filled 2018-01-17: qty 1

## 2018-01-17 MED ORDER — SODIUM CHLORIDE 0.9 % IV SOLN: INTRAVENOUS | Status: DC | PRN

## 2018-01-17 MED ORDER — PREDNISONE 50 MG PO TABS
60.0000 mg | ORAL_TABLET | Freq: Every day | ORAL | Status: DC
  Administered 2018-01-17 – 2018-01-18 (×2): 60 mg via ORAL
  Filled 2018-01-17 (×2): qty 1

## 2018-01-17 NOTE — Progress Notes (Signed)
[facility] PROGRESS NOTE  Sandra Ramsey ZGY:[id number] DOB: 1966/06/22 DOA: 01/13/2018  PCP: No primary care provider on file.  Brief History/Interval Summary: 52 year old Caucasian female with past medical history of COPD who unfortunately continues to smoke presented with shortness of breath.  Was thought to have COPD exacerbation.  There was also concern for pneumonia.  Patient was hospitalized placed on BiPAP.  She continued to have some worsening in her respiratory status.  She was transferred to the ICU.  Did not require intubation.  She was stabilized and then transferred to the floor.  Reason for Visit: Acute COPD exacerbation  Consultants: Pulmonology  Procedures:   Required BiPAP  Transthoracic echocardiogram Study Conclusions  - Left ventricle: The cavity size was normal. Wall thickness was   normal. Systolic function was normal. The estimated ejection   fraction was in the range of 60% to 65%. Wall motion was normal;   there were no regional wall motion abnormalities. Doppler   parameters are consistent with abnormal left ventricular   relaxation (grade 1 diastolic dysfunction).  Antibiotics: Ceftriaxone changed to [place] on 6/28  Subjective/Interval History: Patient states that overall she is improving.  Still gets short of breath when off the oxygen.  No chest pain.    ROS: Denies any headaches.    Objective:  Vital Signs  Vitals:   01/17/18 0641 01/17/18 0700 01/17/18 0825 01/17/18 0826  BP: (!) 127/57     Pulse: 86     Resp: 16     Temp: 98.2 F (36.8 C)     TempSrc: Oral     SpO2: 98% 94% 91% 91%  Weight:      Height:        Intake/Output Summary (Last 24 hours) at 01/17/2018 1009 Last data filed at 01/17/2018 0700 Gross per 24 hour  Intake 660 ml  Output 1300 ml  Net -640 ml   Filed Weights   01/13/18 2312 01/14/18 0000 01/15/18 0400  Weight: 91.6 kg (201 lb 15.1 oz) 92.2 kg (203 lb 4.2 oz) 91.6 kg (201 lb 15.1 oz)    General  appearance: Awake alert.  In no distress. Resp: Mildly tachypneic at rest.  However appears to be improved compared to yesterday.  Coarse breath sounds bilaterally.  No obvious wheezing rales or rhonchi.   Cardio: S1-S2 is normal regular.  No S3-S4.  No rubs murmurs or bruit GI: Abdomen is soft.  Nontender nondistended.  Bowel sounds are present.  No masses organomegaly Extremities: No edema Neurologic: No focal neurological deficits on examination  Lab Results:  Data Reviewed: I have personally reviewed following labs and imaging studies  CBC: Recent Labs  Lab 01/13/18 2310 01/14/18 0328  WBC 17.5* 15.9*  NEUTROABS 14.9*  --   HGB 12.5 11.8*  HCT 40.6 38.2  MCV 92.3 92.7  PLT 338 591    Basic Metabolic Panel: Recent Labs  Lab 01/13/18 2310 01/14/18 0328 01/16/18 0410  NA 136 138 136  K 3.9 4.0 4.6  CL 93* 96* 90*  CO2 33* 32 36*  GLUCOSE 158* 165* 182*  BUN 6 9 25*  CREATININE 0.80 0.77 0.82  CALCIUM 9.1 8.8* 8.6*  MG  --  2.3  --   PHOS  --  3.8  --     GFR: Estimated Creatinine Clearance: 86.1 mL/min (by C-G formula based on SCr of 0.82 mg/dL).  Liver Function Tests: Recent Labs  Lab 01/13/18 2310 01/16/18 0410  AST 15 19  ALT 29 36  ALKPHOS 178* 138*  BILITOT 0.5 0.3  PROT 7.5 6.9  ALBUMIN 3.2* 3.2*    Cardiac Enzymes: Recent Labs  Lab 01/13/18 2310 01/14/18 0328 01/14/18 0900  TROPONINI 0.10* <0.03 <0.03    CBG: Recent Labs  Lab 01/15/18 2110 01/16/18 1205 01/16/18 1707 01/16/18 2135 01/17/18 0755  GLUCAP 231* 128* 156* 156* 163*     Recent Results (from the past 240 hour(s))  MRSA PCR Screening     Status: None   Collection Time: 01/13/18  9:23 PM  Result Value Ref Range Status   MRSA by PCR NEGATIVE NEGATIVE Final    Comment:        The GeneXpert MRSA Assay (FDA approved for NASAL specimens only), is one component of a comprehensive MRSA colonization surveillance program. It is not intended to diagnose MRSA infection  nor to guide or monitor treatment for MRSA infections. Performed at [facility], [place] [address] [place], [place] [address]   Culture, blood (routine x 2)     Status: None (Preliminary result)   Collection Time: 01/13/18 10:45 PM  Result Value Ref Range Status   Specimen Description BLOOD RIGHT WRIST  Final   Special Requests   Final    BOTTLES DRAWN AEROBIC ONLY Blood Culture adequate volume   Culture   Final    NO GROWTH 3 DAYS Performed at [facility], [place] [address], [place], [place] [address]    Report Status PENDING  Incomplete  Culture, blood (routine x 2)     Status: None (Preliminary result)   Collection Time: 01/13/18 11:00 PM  Result Value Ref Range Status   Specimen Description BLOOD LEFT HAND  Final   Special Requests IN PEDIATRIC BOTTLE Blood Culture adequate volume  Final   Culture   Final    NO GROWTH 3 DAYS Performed at [facility], [place] [address], [place], [place] [address]    Report Status PENDING  Incomplete  Respiratory Panel by PCR     Status: None   Collection Time: 01/14/18  4:28 AM  Result Value Ref Range Status   Adenovirus NOT DETECTED NOT DETECTED Final   Coronavirus 229E NOT DETECTED NOT DETECTED Final   Coronavirus HKU1 NOT DETECTED NOT DETECTED Final   Coronavirus NL63 NOT DETECTED NOT DETECTED Final   Coronavirus OC43 NOT DETECTED NOT DETECTED Final   Metapneumovirus NOT DETECTED NOT DETECTED Final   Rhinovirus / Enterovirus NOT DETECTED NOT DETECTED Final   Influenza A NOT DETECTED NOT DETECTED Final   Influenza B NOT DETECTED NOT DETECTED Final   Parainfluenza Virus 1 NOT DETECTED NOT DETECTED Final   Parainfluenza Virus 2 NOT DETECTED NOT DETECTED Final   Parainfluenza Virus 3 NOT DETECTED NOT DETECTED Final   Parainfluenza Virus 4 NOT DETECTED NOT DETECTED Final   Respiratory Syncytial Virus NOT DETECTED NOT DETECTED Final   Bordetella pertussis NOT DETECTED NOT DETECTED Final   Chlamydophila pneumoniae NOT DETECTED  NOT DETECTED Final   Mycoplasma pneumoniae NOT DETECTED NOT DETECTED Final    Comment: Performed at [facility], [place] [address], [place], [place] [address]      Radiology Studies: No results found.   Medications:  Scheduled: . benzonatate  100 mg Oral TID  . budesonide (PULMICORT) nebulizer solution  0.5 mg Nebulization BID  . dextromethorphan-guaiFENesin  1 tablet Oral BID  . enoxaparin (LOVENOX) injection  40 mg Subcutaneous Q24H  . feeding supplement (ENSURE ENLIVE)  237 mL Oral BID BM  .  fluconazole  100 mg Oral Daily  . insulin aspart  0-15 Units Subcutaneous TID WC  . insulin aspart  0-5 Units Subcutaneous QHS  . ipratropium-albuterol  3 mL Nebulization Q6H  . methylPREDNISolone (SOLU-MEDROL) injection  40 mg Intravenous Q12H  . nicotine  14 mg Transdermal Daily  . nystatin  5 mL Oral QID  . pantoprazole  40 mg Oral BID   Continuous: . sodium chloride    . cefTRIAXone (ROCEPHIN)  IV Stopped (01/17/18 0730)   TMH:DQQIWL chloride, guaiFENesin, sodium chloride  Assessment/Plan:    Acute on chronic hypoxemic and hypercapnic respiratory failure Secondary to COPD exacerbation and pneumonia.  She was monitored in the ICU and was on BiPAP.  She was stabilized and then transferred to the floor.  Wean down oxygen.  Sats need not be any better than 89%.  Quite possible she may need home oxygen.  Acute COPD exacerbation Patient required BiPAP in the ICU.  Did not require intubation.  Respiratory status appears to have improved.  Continue to taper down steroids.  Change to oral.  Change to oral antibiotics.  Continue with nebulizer treatments.  As per pulmonology she will need to be discharged home on a maintenance regimen of Spiriva and Dulera.  She is not to take Advair.  She will need PRN albuterol, prednisone taper and oxygen if needed.  She has an appointment already made with pulmonary from [facility] satellite program. CBG's were high due to steroids.  Improving as  steroid is being tapered down.  Community-acquired pneumonia Has been on antibiotics recently including Levaquin.  Does not need atypical coverage.  Change ceftriaxone to Omnicef.  Pulmonary nodules Patient apparently has a remote history of breast and ovarian cancer.  She will need outpatient CT surveillance.  Oral candidiasis Continue Diflucan and nystatin swish and swallow.  Tobacco abuse Patient unfortunately continues to smoke cigarettes.  She was counseled.  Continue nicotine patch.  Acute metabolic encephalopathy Thought to be secondary to benzodiazepine.  Mental status appears to have improved.  Seems to be close to baseline.    DVT Prophylaxis: Lovenox    Code Status: Full code Family Communication: Discussed with the patient Disposition Plan: Mobilize.  Check room air saturations.  Hopefully discharge in 1 to 2 days.    LOS: 4 days   [facility] Pager [phone] 01/17/2018, 10:09 AM  If 7PM-7AM, please contact night-coverage at [email or web address], password [facility]

## 2018-01-18 DIAGNOSIS — J441 Chronic obstructive pulmonary disease with (acute) exacerbation: Principal | ICD-10-CM

## 2018-01-18 LAB — GLUCOSE, CAPILLARY
GLUCOSE-CAPILLARY: 143 mg/dL — AB (ref 70–99)
GLUCOSE-CAPILLARY: 123 mg/dL — AB (ref 70–99)

## 2018-01-18 MED ORDER — IPRATROPIUM-ALBUTEROL 0.5-2.5 (3) MG/3ML IN SOLN
3.0000 mL | Freq: Three times a day (TID) | RESPIRATORY_TRACT | Status: DC
  Administered 2018-01-18: 3 mL via RESPIRATORY_TRACT
  Filled 2018-01-18: qty 3

## 2018-01-18 MED ORDER — MOMETASONE FURO-FORMOTEROL FUM 100-5 MCG/ACT IN AERO: 2.0000 | INHALATION_SPRAY | Freq: Two times a day (BID) | RESPIRATORY_TRACT | 0 refills | Status: AC

## 2018-01-18 MED ORDER — PREDNISONE 20 MG PO TABS
20.0000 mg | ORAL_TABLET | Freq: Every day | ORAL | Status: DC
  Filled 2018-01-18: qty 18

## 2018-01-18 MED ORDER — BENZONATATE 100 MG PO CAPS: 100.0000 mg | ORAL_CAPSULE | Freq: Three times a day (TID) | ORAL | 0 refills | Status: DC

## 2018-01-18 MED ORDER — PREDNISONE 50 MG PO TABS: 20.0000 mg | ORAL_TABLET | Freq: Every day | ORAL | Status: DC

## 2018-01-18 MED ORDER — MOMETASONE FURO-FORMOTEROL FUM 100-5 MCG/ACT IN AERO: 2.0000 | INHALATION_SPRAY | Freq: Two times a day (BID) | RESPIRATORY_TRACT | 1 refills | Status: DC

## 2018-01-18 MED ORDER — FLUCONAZOLE 100 MG PO TABS: 100.0000 mg | ORAL_TABLET | Freq: Every day | ORAL | Status: DC

## 2018-01-18 MED ORDER — PREDNISONE 20 MG PO TABS: ORAL_TABLET | ORAL | 0 refills | Status: AC

## 2018-01-18 MED ORDER — FLUCONAZOLE 100 MG PO TABS: 100.0000 mg | ORAL_TABLET | Freq: Every day | ORAL | 0 refills | Status: DC

## 2018-01-18 MED ORDER — BENZONATATE 100 MG PO CAPS
100.0000 mg | ORAL_CAPSULE | Freq: Three times a day (TID) | ORAL | Status: DC
  Administered 2018-01-18: 100 mg via ORAL
  Filled 2018-01-18 (×2): qty 9
  Filled 2018-01-18: qty 1

## 2018-01-18 MED ORDER — FLUCONAZOLE 100 MG PO TABS: 100.0000 mg | ORAL_TABLET | Freq: Every day | ORAL | 0 refills | Status: AC

## 2018-01-18 MED ORDER — BENZONATATE 100 MG PO CAPS: 100.0000 mg | ORAL_CAPSULE | Freq: Three times a day (TID) | ORAL | 0 refills | Status: AC

## 2018-01-18 MED ORDER — CEFDINIR 300 MG PO CAPS: 300.0000 mg | ORAL_CAPSULE | Freq: Two times a day (BID) | ORAL | 0 refills | Status: AC

## 2018-01-18 MED ORDER — MOMETASONE FURO-FORMOTEROL FUM 100-5 MCG/ACT IN AERO
2.0000 | INHALATION_SPRAY | Freq: Two times a day (BID) | RESPIRATORY_TRACT | Status: DC
  Filled 2018-01-18: qty 8.8

## 2018-01-18 MED ORDER — PANTOPRAZOLE SODIUM 40 MG PO TBEC: 40.0000 mg | DELAYED_RELEASE_TABLET | Freq: Every day | ORAL | 0 refills | Status: AC

## 2018-01-18 MED ORDER — FLUCONAZOLE 100 MG PO TABS
100.0000 mg | ORAL_TABLET | Freq: Every day | ORAL | Status: DC
  Filled 2018-01-18: qty 1

## 2018-01-18 NOTE — Discharge Summary (Signed)
[facility]  Physician Discharge Summary   Patient ID: Sandra Ramsey MRN: [id number] DOB/AGE: 1966/03/21 52 y.o.  Admit date: 01/13/2018 Discharge date: 01/18/2018  PCP: No primary care provider on file.  DISCHARGE DIAGNOSES:  Acute COPD exacerbation Community-acquired pneumonia  RECOMMENDATIONS FOR OUTPATIENT FOLLOW UP: 1. Patient has been set up with home oxygen. 2. Needs outpatient surveillance for pulmonary nodules.   DISCHARGE CONDITION: fair  Diet recommendation: As before  Filed Weights   01/15/18 0400 01/17/18 1300 01/18/18 0700  Weight: 91.6 kg (201 lb 15.1 oz) 91.2 kg (201 lb 1 oz) 92.1 kg (203 lb)    INITIAL HISTORY: 52 year old Caucasian female with past medical history of COPD who unfortunately continues to smoke presented with shortness of breath.  Was thought to have COPD exacerbation.  There was also concern for pneumonia.  Patient was hospitalized placed on BiPAP.  She continued to have some worsening in her respiratory status.  She was transferred to the ICU.  Did not require intubation.  She was stabilized and then transferred to the floor.  Consultants: Pulmonology  Procedures:   Required BiPAP  Transthoracic echocardiogram Study Conclusions  - Left ventricle: The cavity size was normal. Wall thickness was normal. Systolic function was normal. The estimated ejection fraction was in the range of 60% to 65%. Wall motion was normal; there were no regional wall motion abnormalities. Doppler parameters are consistent with abnormal left ventricular relaxation (grade 1 diastolic dysfunction).   HOSPITAL COURSE:    Acute on chronic hypoxemic and hypercapnic respiratory failure Secondary to COPD exacerbation and pneumonia.  She was monitored in the ICU and was on BiPAP.  She was stabilized and then transferred to the floor.    She has improved from a respiratory standpoint.  Ambulatory sats dropped down to the early 80s.  She will  need home oxygen.    Acute COPD exacerbation Patient required BiPAP in the ICU.  Did not require intubation.  Respiratory status appears to have improved.    She was changed over to oral steroids and oral antibiotics.  As per pulmonology she will need to be discharged home on a maintenance regimen of Spiriva and Dulera.  She is not to take Advair. She has an appointment already made with pulmonary from [facility] satellite program. CBG's were high due to steroids.  Improving as steroid is being tapered down.  Community-acquired pneumonia Has been on antibiotics recently including Levaquin.  Does not need atypical coverage.  Changed ceftriaxone to [facility] for 2 more days.  Pulmonary nodules Patient apparently has a remote history of breast and ovarian cancer.  She will need outpatient CT surveillance.  Oral candidiasis Continue Diflucan  Tobacco abuse Patient unfortunately continues to smoke cigarettes.  She was counseled.  Acute metabolic encephalopathy Thought to be secondary to benzodiazepine.    Mental status is back to baseline.  Overall improved.  Patient wants to go home today.  She seems to be stable from a respiratory standpoint.  She will need home oxygen.  Medication assistance requested through case management.  Okay for discharge home.     PERTINENT LABS:  The results of significant diagnostics from this hospitalization (including imaging, microbiology, ancillary and laboratory) are listed below for reference.    Microbiology: Recent Results (from the past 240 hour(s))  MRSA PCR Screening     Status: None   Collection Time: 01/13/18  9:23 PM  Result Value Ref Range Status   MRSA by PCR NEGATIVE NEGATIVE Final    Comment:  The GeneXpert MRSA Assay (FDA approved for NASAL specimens only), is one component of a comprehensive MRSA colonization surveillance program. It is not intended to diagnose MRSA infection nor to guide or monitor treatment for MRSA  infections. Performed at [facility], [place] [address] [place], [place] [address]   Culture, blood (routine x 2)     Status: None (Preliminary result)   Collection Time: 01/13/18 10:45 PM  Result Value Ref Range Status   Specimen Description BLOOD RIGHT WRIST  Final   Special Requests   Final    BOTTLES DRAWN AEROBIC ONLY Blood Culture adequate volume   Culture   Final    NO GROWTH 4 DAYS Performed at [facility], [place] [address] [place], [place] [address]    Report Status PENDING  Incomplete  Culture, blood (routine x 2)     Status: None (Preliminary result)   Collection Time: 01/13/18 11:00 PM  Result Value Ref Range Status   Specimen Description BLOOD LEFT HAND  Final   Special Requests IN PEDIATRIC BOTTLE Blood Culture adequate volume  Final   Culture   Final    NO GROWTH 4 DAYS Performed at [facility], [place] [address], [place], [place] [address]    Report Status PENDING  Incomplete  Respiratory Panel by PCR     Status: None   Collection Time: 01/14/18  4:28 AM  Result Value Ref Range Status   Adenovirus NOT DETECTED NOT DETECTED Final   Coronavirus 229E NOT DETECTED NOT DETECTED Final   Coronavirus HKU1 NOT DETECTED NOT DETECTED Final   Coronavirus NL63 NOT DETECTED NOT DETECTED Final   Coronavirus OC43 NOT DETECTED NOT DETECTED Final   Metapneumovirus NOT DETECTED NOT DETECTED Final   Rhinovirus / Enterovirus NOT DETECTED NOT DETECTED Final   Influenza A NOT DETECTED NOT DETECTED Final   Influenza B NOT DETECTED NOT DETECTED Final   Parainfluenza Virus 1 NOT DETECTED NOT DETECTED Final   Parainfluenza Virus 2 NOT DETECTED NOT DETECTED Final   Parainfluenza Virus 3 NOT DETECTED NOT DETECTED Final   Parainfluenza Virus 4 NOT DETECTED NOT DETECTED Final   Respiratory Syncytial Virus NOT DETECTED NOT DETECTED Final   Bordetella pertussis NOT DETECTED NOT DETECTED Final   Chlamydophila pneumoniae NOT DETECTED NOT DETECTED Final   Mycoplasma pneumoniae  NOT DETECTED NOT DETECTED Final    Comment: Performed at [facility], [place] [address] [place], [place] [address]     Labs: Basic Metabolic Panel: Recent Labs  Lab 01/13/18 2310 01/14/18 0328 01/16/18 0410  NA 136 138 136  K 3.9 4.0 4.6  CL 93* 96* 90*  CO2 33* 32 36*  GLUCOSE 158* 165* 182*  BUN 6 9 25*  CREATININE 0.80 0.77 0.82  CALCIUM 9.1 8.8* 8.6*  MG  --  2.3  --   PHOS  --  3.8  --    Liver Function Tests: Recent Labs  Lab 01/13/18 2310 01/16/18 0410  AST 15 19  ALT 29 36  ALKPHOS 178* 138*  BILITOT 0.5 0.3  PROT 7.5 6.9  ALBUMIN 3.2* 3.2*   CBC: Recent Labs  Lab 01/13/18 2310 01/14/18 0328  WBC 17.5* 15.9*  NEUTROABS 14.9*  --   HGB 12.5 11.8*  HCT 40.6 38.2  MCV 92.3 92.7  PLT 338 357   Cardiac Enzymes: Recent Labs  Lab 01/13/18 2310 01/14/18 0328 01/14/18 0900  TROPONINI 0.10* <0.03 <0.03   BNP: BNP (last 3 results) Recent  Labs    01/13/18 2310  BNP 225.5*     CBG: Recent Labs  Lab 01/17/18 1253 01/17/18 1744 01/17/18 2205 01/18/18 0754 01/18/18 1202  GLUCAP 172* 131* 147* 143* 123*     IMAGING STUDIES Dg Chest Port 1 View  Result Date: 01/15/2018 CLINICAL DATA:  Follow-up pneumonia EXAM: PORTABLE CHEST 1 VIEW COMPARISON:  01/13/2018 FINDINGS: Cardiac shadow is stable. The lungs are well aerated bilaterally. The nodular changes are less prominent on the current exam. New right basilar infiltrate is noted. No bony abnormality is seen. IMPRESSION: New right basilar infiltrate. The previously seen nodular changes are less well appreciated. Electronically Signed   By: Inez Catalina M.D.   On: 01/15/2018 07:15   Dg Chest Port 1 View  Result Date: 01/13/2018 CLINICAL DATA:  Dyspnea, cough EXAM: PORTABLE CHEST 1 VIEW COMPARISON:  None. FINDINGS: Normal heart size. Normal mediastinal contour. No pneumothorax. No pleural effusion. Patchy faintly nodular opacities throughout both lungs, most prominent in the lower lungs.  IMPRESSION: Patchy faintly nodular opacities throughout both lungs, most prominent in the lower lungs. Per epic, the patient has a remote history of breast and ovarian cancer. Chest CT recommended for further characterization of these opacities. Electronically Signed   By: Ilona Sorrel M.D.   On: 01/13/2018 22:10    DISCHARGE EXAMINATION: Vitals:   01/18/18 0700 01/18/18 0849 01/18/18 1314 01/18/18 1413  BP:   (!) 151/75   Pulse:   97   Resp:   18   Temp:   98.3 F (36.8 C)   TempSrc:      SpO2:  97% 91% 98%  Weight: 92.1 kg (203 lb)     Height:       General appearance: alert, cooperative, appears stated age and no distress Resp: Few crackles at the bases bilaterally right more than left.  No wheezing or rhonchi. Cardio: regular rate and rhythm, S1, S2 normal, no murmur, click, rub or gallop GI: soft, non-tender; bowel sounds normal; no masses,  no organomegaly  DISPOSITION: Home  Discharge Instructions    Call MD for:  difficulty breathing, headache or visual disturbances   Complete by:  As directed    Call MD for:  extreme fatigue   Complete by:  As directed    Call MD for:  persistant dizziness or light-headedness   Complete by:  As directed    Call MD for:  persistant nausea and vomiting   Complete by:  As directed    Call MD for:  severe uncontrolled pain   Complete by:  As directed    Call MD for:  temperature >100.4   Complete by:  As directed    Discharge instructions   Complete by:  As directed    Please stop smoking.  With outpatient providers.  You will need to use oxygen for now.  You were cared for by a hospitalist during your hospital stay. If you have any questions about your discharge medications or the care you received while you were in the hospital after you are discharged, you can call the unit and asked to speak with the hospitalist on call if the hospitalist that took care of you is not available. Once you are discharged, your primary care physician will  handle any further medical issues. Please note that NO REFILLS for any discharge medications will be authorized once you are discharged, as it is imperative that you return to your primary care physician (or establish a relationship with a primary  care physician if you do not have one) for your aftercare needs so that they can reassess your need for medications and monitor your lab values. If you do not have a primary care physician, you can call [phone] for a physician referral.   Increase activity slowly   Complete by:  As directed        SOME OF THE MEDICATIONS ARE LISTED TWICE AS CASE MANAGER REQUESTED DUPLICATE PRESCRIPTIONS FOR 3 DAY SUPPLY FOR MEDICATION ASSISTANCE.  Allergies as of 01/18/2018      Reactions   Tomato Hives      Medication List    STOP taking these medications   ADVAIR DISKUS 500-50 MCG/DOSE Aepb Generic drug:  Fluticasone-Salmeterol     TAKE these medications   albuterol (2.5 MG/3ML) 0.083% nebulizer solution Commonly known as:  PROVENTIL Take 2.5 mg by nebulization every 6 (six) hours as needed for wheezing or shortness of breath.   albuterol 108 (90 Base) MCG/ACT inhaler Commonly known as:  VENTOLIN HFA Inhale 1-2 puffs into the lungs every 4 (four) hours as needed for wheezing or shortness of breath.   benzonatate 100 MG capsule Commonly known as:  TESSALON Take 1 capsule (100 mg total) by mouth 3 (three) times daily for 3 days.   cefdinir 300 MG capsule Commonly known as:  OMNICEF Take 1 capsule (300 mg total) by mouth every 12 (twelve) hours for 2 days.   CHANTIX 0.5 MG tablet Generic drug:  varenicline Take 1 tablet by mouth daily.   fluconazole 100 MG tablet Commonly known as:  DIFLUCAN Take 1 tablet (100 mg total) by mouth daily for 5 days. Start taking on:  01/19/2018   ipratropium-albuterol 0.5-2.5 (3) MG/3ML Soln Commonly known as:  DUONEB Take 3 mLs by nebulization every 4 (four) hours as needed.   mometasone-formoterol 100-5  MCG/ACT Aero Commonly known as:  DULERA Inhale 2 puffs into the lungs 2 (two) times daily.   pantoprazole 40 MG tablet Commonly known as:  PROTONIX Take 1 tablet (40 mg total) by mouth daily.   predniSONE 20 MG tablet Commonly known as:  DELTASONE Take 3 tablets once daily for 3 days, followed by 2 tablets once daily for 3 days, followed by 1 tablet once daily for 3 days and then stop.   predniSONE 20 MG tablet Commonly known as:  DELTASONE Take 3 tablets once daily for 3 days ans then as per other script.   PRIMATENE ASTHMA 12.5-200 MG Tabs Generic drug:  ePHEDrine-guaiFENesin Take 1 tablet by mouth daily. Primatene   PRIMATENE MIST 0.125 MG/ACT Aero Generic drug:  EPINEPHrine Inhale 1 puff into the lungs every 3 (three) hours. Primatene Mist   SPIRIVA HANDIHALER 18 MCG inhalation capsule Generic drug:  tiotropium Place 1 puff into inhaler and inhale daily.            Durable Medical Equipment  (From admission, onward)        Start     Ordered   01/18/18 1158  For home use only DME oxygen  Once    Question Answer Comment  Mode or (Route) Nasal cannula   Liters per Minute 2   Frequency Continuous (stationary and portable oxygen unit needed)   Oxygen conserving device Yes   Oxygen delivery system Gas      01/18/18 1158         TOTAL DISCHARGE TIME: 35 MINS  [facility] Pager [phone]  01/18/2018, 2:49 PM

## 2018-01-18 NOTE — Progress Notes (Addendum)
Requested [facility] to screen patient for home O2, requested Dr Maryland Pink to provide w 3 days of Rx to be filled at [facility] for patient to use until she can get her to pharmacy Monday, MATCH system down. Explained to patient she will get 3 days of meds and will still need to get the rest of her meds Monday at her pharmacy, provided her with her home Rxs.  Rxs delivered to pharmacy, they will bedside RN or myself when filled. No further CM needs.

## 2018-01-18 NOTE — Discharge Instructions (Signed)

## 2018-01-18 NOTE — Progress Notes (Signed)
Nsg Discharge Note  Admit Date:  01/13/2018 Discharge date: 01/18/2018   Sandra Ramsey to be D/C'd Home per MD order.  AVS completed.  Copy for chart, and copy for patient signed, and dated. Patient/caregiver able to verbalize understanding.  Discharge Medication: Allergies as of 01/18/2018      Reactions   Tomato Hives      Medication List    STOP taking these medications   ADVAIR DISKUS 500-50 MCG/DOSE Aepb Generic drug:  Fluticasone-Salmeterol     TAKE these medications   albuterol (2.5 MG/3ML) 0.083% nebulizer solution Commonly known as:  PROVENTIL Take 2.5 mg by nebulization every 6 (six) hours as needed for wheezing or shortness of breath.   albuterol 108 (90 Base) MCG/ACT inhaler Commonly known as:  VENTOLIN HFA Inhale 1-2 puffs into the lungs every 4 (four) hours as needed for wheezing or shortness of breath.   benzonatate 100 MG capsule Commonly known as:  TESSALON Take 1 capsule (100 mg total) by mouth 3 (three) times daily for 3 days.   cefdinir 300 MG capsule Commonly known as:  OMNICEF Take 1 capsule (300 mg total) by mouth every 12 (twelve) hours for 2 days.   CHANTIX 0.5 MG tablet Generic drug:  varenicline Take 1 tablet by mouth daily.   fluconazole 100 MG tablet Commonly known as:  DIFLUCAN Take 1 tablet (100 mg total) by mouth daily for 5 days. Start taking on:  01/19/2018   ipratropium-albuterol 0.5-2.5 (3) MG/3ML Soln Commonly known as:  DUONEB Take 3 mLs by nebulization every 4 (four) hours as needed.   mometasone-formoterol 100-5 MCG/ACT Aero Commonly known as:  DULERA Inhale 2 puffs into the lungs 2 (two) times daily.   pantoprazole 40 MG tablet Commonly known as:  PROTONIX Take 1 tablet (40 mg total) by mouth daily.   predniSONE 20 MG tablet Commonly known as:  DELTASONE Take 3 tablets once daily for 3 days, followed by 2 tablets once daily for 3 days, followed by 1 tablet once daily for 3 days and then stop.   predniSONE 20 MG  tablet Commonly known as:  DELTASONE Take 3 tablets once daily for 3 days ans then as per other script.   PRIMATENE ASTHMA 12.5-200 MG Tabs Generic drug:  ePHEDrine-guaiFENesin Take 1 tablet by mouth daily. Primatene   PRIMATENE MIST 0.125 MG/ACT Aero Generic drug:  EPINEPHrine Inhale 1 puff into the lungs every 3 (three) hours. Primatene Mist   SPIRIVA HANDIHALER 18 MCG inhalation capsule Generic drug:  tiotropium Place 1 puff into inhaler and inhale daily.            Durable Medical Equipment  (From admission, onward)        Start     Ordered   01/18/18 1158  For home use only DME oxygen  Once    Question Answer Comment  Mode or (Route) Nasal cannula   Liters per Minute 2   Frequency Continuous (stationary and portable oxygen unit needed)   Oxygen conserving device Yes   Oxygen delivery system Gas      01/18/18 1158      Discharge Assessment: Vitals:   01/18/18 1314 01/18/18 1413  BP: (!) 151/75   Pulse: 97   Resp: 18   Temp: 98.3 F (36.8 C)   SpO2: 91% 98%   Skin clean, dry and intact without evidence of skin break down, no evidence of skin tears noted. IV catheter discontinued intact. Site without signs and symptoms of complications - no  redness or edema noted at insertion site, patient denies c/o pain - only slight tenderness at site.  Dressing with slight pressure applied.  D/c Instructions-Education: Discharge instructions given to patient/family with verbalized understanding. D/c education completed with patient/family including follow up instructions, medication list, d/c activities limitations if indicated, with other d/c instructions as indicated by MD - patient able to verbalize understanding, all questions fully answered. Patient instructed to return to ED, call 911, or call MD for any changes in condition.  Patient escorted via [place], and D/C home via private auto.  Alastor Kneale Margaretha Sheffield, RN 01/18/2018 4:19 PM

## 2018-01-18 NOTE — Progress Notes (Signed)
SATURATION QUALIFICATIONS: (This note is used to comply with regulatory documentation for home oxygen)  Patient Saturations on Room Air at Rest = 88% room air  Patient Saturations on Room Air while Ambulating = 81% room air  Patient Saturations on 2 Liters of oxygen while Ambulating = 90%  Please briefly explain why patient needs home oxygen:

## 2018-01-19 LAB — CULTURE, BLOOD (ROUTINE X 2)
Culture: NO GROWTH
Culture: NO GROWTH
SPECIAL REQUESTS: ADEQUATE
Special Requests: ADEQUATE

## 2020-01-09 IMAGING — DX DG CHEST 1V PORT
1 series · 1 of 1 positions shown · non-contrast
Comparison: 01/13/2018

CLINICAL DATA: Follow-up pneumonia

EXAM:
PORTABLE CHEST 1 VIEW

[chest ap]
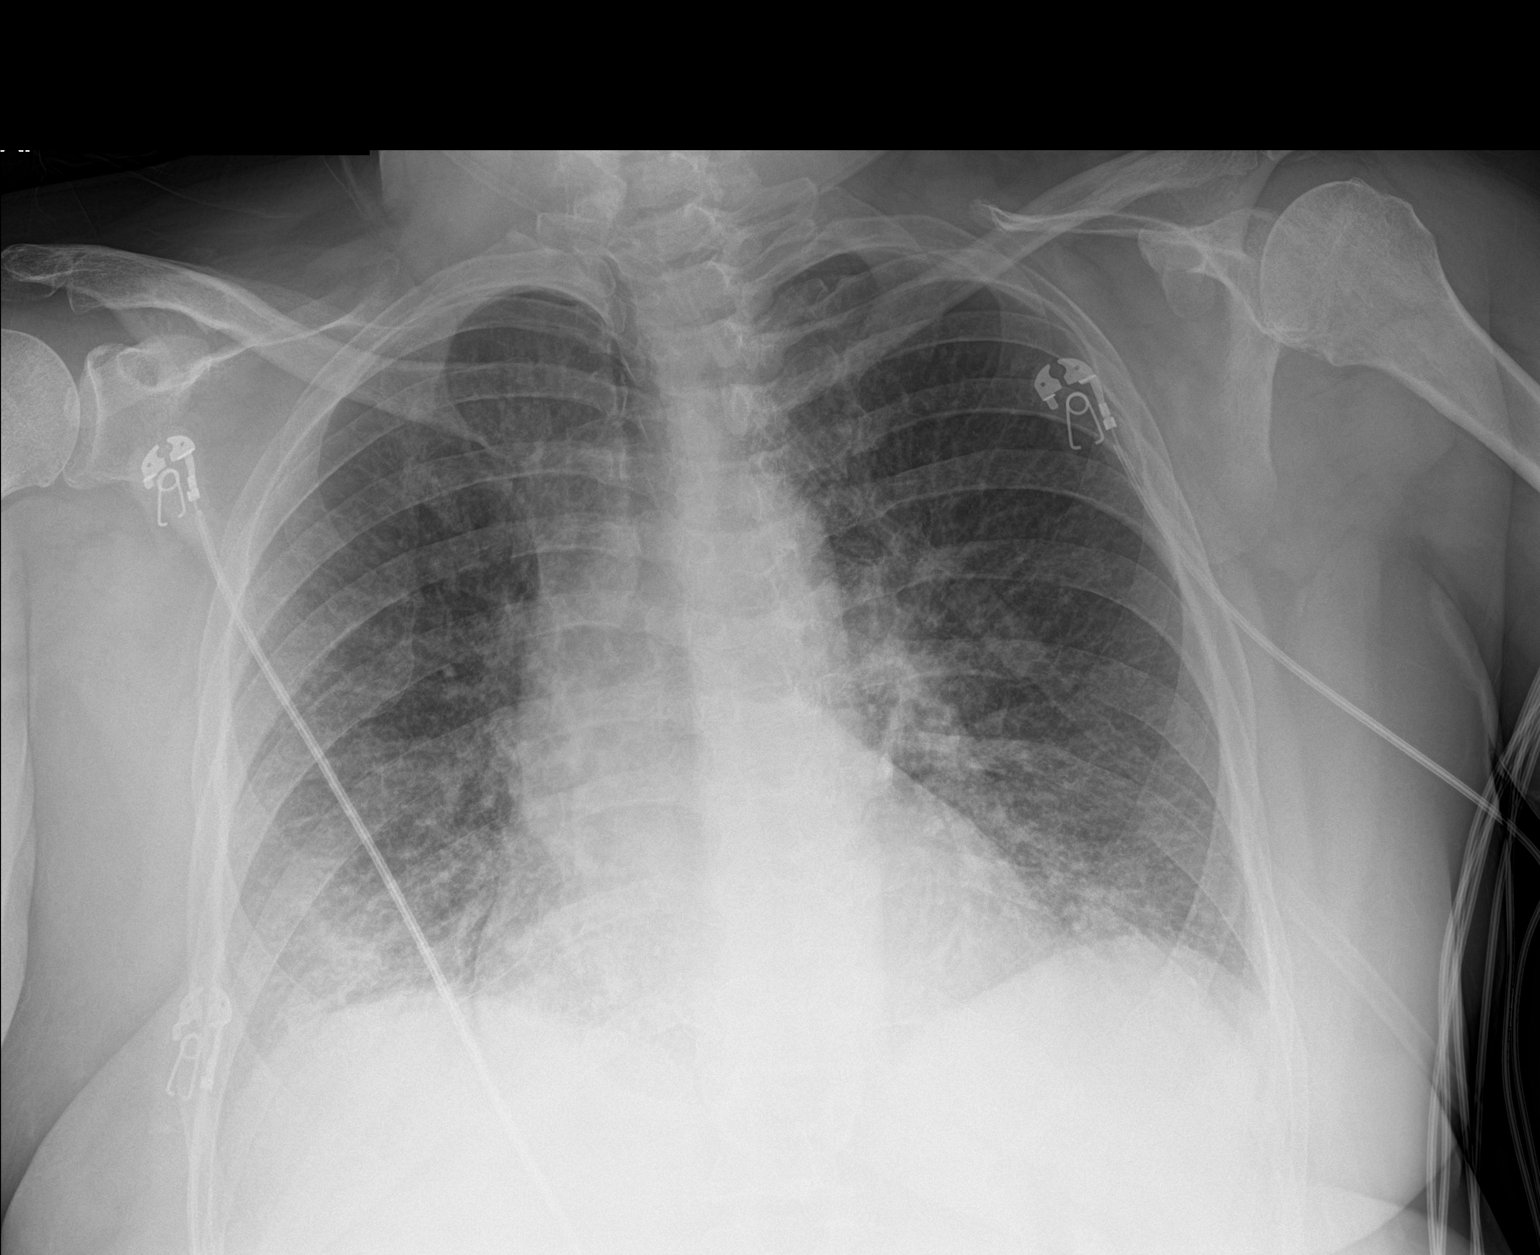

[1 of 1 positions shown; findings below may reference images not displayed]

FINDINGS: Cardiac shadow is stable. The lungs are well aerated bilaterally.
The nodular changes are less prominent on the current exam. New
right basilar infiltrate is noted. No bony abnormality is seen.
IMPRESSION: New right basilar infiltrate. The previously seen nodular changes
are less well appreciated.
